# Patient Record
Sex: Female | Born: 1985 | Race: Black or African American | Hispanic: No | Marital: Married | Smoking: Never smoker
Health system: Southern US, Community
[De-identification: ages and names within clinical notes are randomized; demographics above are authoritative.]

## PROBLEM LIST (undated history)

## (undated) ENCOUNTER — Inpatient Hospital Stay (HOSPITAL_COMMUNITY): Payer: Self-pay

---

## 2009-10-16 ENCOUNTER — Emergency Department (HOSPITAL_COMMUNITY): Admission: EM | Admit: 2009-10-16 | Discharge: 2009-10-16 | Payer: Self-pay | Admitting: Family Medicine

## 2009-10-20 ENCOUNTER — Inpatient Hospital Stay (HOSPITAL_COMMUNITY): Admission: AD | Admit: 2009-10-20 | Discharge: 2009-10-20 | Payer: Self-pay | Admitting: Obstetrics and Gynecology

## 2009-10-20 ENCOUNTER — Emergency Department (HOSPITAL_COMMUNITY): Admission: EM | Admit: 2009-10-20 | Discharge: 2009-10-20 | Payer: Self-pay | Admitting: Family Medicine

## 2009-10-20 ENCOUNTER — Ambulatory Visit: Payer: Self-pay | Admitting: Advanced Practice Midwife

## 2009-10-31 ENCOUNTER — Ambulatory Visit: Payer: Self-pay | Admitting: Family

## 2009-10-31 ENCOUNTER — Inpatient Hospital Stay (HOSPITAL_COMMUNITY): Admission: AD | Admit: 2009-10-31 | Discharge: 2009-10-31 | Payer: Self-pay | Admitting: Obstetrics & Gynecology

## 2010-01-14 ENCOUNTER — Ambulatory Visit (HOSPITAL_COMMUNITY): Admission: RE | Admit: 2010-01-14 | Discharge: 2010-01-14 | Payer: Self-pay | Admitting: Obstetrics & Gynecology

## 2010-04-05 ENCOUNTER — Emergency Department (HOSPITAL_COMMUNITY): Admission: EM | Admit: 2010-04-05 | Discharge: 2010-04-05 | Payer: Self-pay | Admitting: Emergency Medicine

## 2010-05-06 ENCOUNTER — Emergency Department (HOSPITAL_COMMUNITY): Admission: EM | Admit: 2010-05-06 | Discharge: 2010-05-06 | Payer: Self-pay | Admitting: Emergency Medicine

## 2010-06-12 ENCOUNTER — Encounter: Payer: Self-pay | Admitting: Obstetrics & Gynecology

## 2010-06-12 ENCOUNTER — Ambulatory Visit: Payer: Self-pay | Admitting: Obstetrics & Gynecology

## 2010-07-23 ENCOUNTER — Inpatient Hospital Stay (HOSPITAL_COMMUNITY): Admission: AD | Admit: 2010-07-23 | Discharge: 2010-06-15 | Payer: Self-pay | Admitting: Obstetrics & Gynecology

## 2010-09-05 ENCOUNTER — Encounter: Payer: Self-pay | Admitting: Family Medicine

## 2010-10-28 LAB — CBC
HCT: 21.3 % — ABNORMAL LOW (ref 36.0–46.0)
HCT: 27.1 % — ABNORMAL LOW (ref 36.0–46.0)
HCT: 33.7 % — ABNORMAL LOW (ref 36.0–46.0)
Hemoglobin: 11 g/dL — ABNORMAL LOW (ref 12.0–15.0)
Hemoglobin: 6.9 g/dL — CL (ref 12.0–15.0)
MCH: 24.7 pg — ABNORMAL LOW (ref 26.0–34.0)
MCH: 26.4 pg (ref 26.0–34.0)
MCHC: 32.4 g/dL (ref 30.0–36.0)
MCV: 79.4 fL (ref 78.0–100.0)
Platelets: 103 10*3/uL — ABNORMAL LOW (ref 150–400)
RBC: 2.79 MIL/uL — ABNORMAL LOW (ref 3.87–5.11)
RBC: 3.42 MIL/uL — ABNORMAL LOW (ref 3.87–5.11)
RBC: 4.43 MIL/uL (ref 3.87–5.11)
RDW: 19.9 % — ABNORMAL HIGH (ref 11.5–15.5)
WBC: 10.2 10*3/uL (ref 4.0–10.5)

## 2010-10-28 LAB — TYPE AND SCREEN

## 2010-10-28 LAB — RPR: RPR Ser Ql: NONREACTIVE

## 2010-11-09 LAB — CBC
Hemoglobin: 12.7 g/dL (ref 12.0–15.0)
Hemoglobin: 12.8 g/dL (ref 12.0–15.0)
MCHC: 33.6 g/dL (ref 30.0–36.0)
MCHC: 33.9 g/dL (ref 30.0–36.0)
MCV: 91.9 fL (ref 78.0–100.0)
RBC: 4.09 MIL/uL (ref 3.87–5.11)
RDW: 12.7 % (ref 11.5–15.5)
WBC: 5.2 10*3/uL (ref 4.0–10.5)

## 2010-11-09 LAB — COMPREHENSIVE METABOLIC PANEL
ALT: 21 U/L (ref 0–35)
AST: 24 U/L (ref 0–37)
Alkaline Phosphatase: 56 U/L (ref 39–117)
CO2: 23 mEq/L (ref 19–32)
Calcium: 8.8 mg/dL (ref 8.4–10.5)
Chloride: 105 mEq/L (ref 96–112)
GFR calc Af Amer: >60 mL/min (ref >60)
GFR calc non Af Amer: >60 mL/min (ref >60)
Glucose, Bld: 67 mg/dL — ABNORMAL LOW (ref 70–99)
Potassium: 3.4 mEq/L — ABNORMAL LOW (ref 3.5–5.1)
Sodium: 134 mEq/L — ABNORMAL LOW (ref 135–145)
Total Bilirubin: 0.4 mg/dL (ref 0.3–1.2)

## 2010-11-09 LAB — WET PREP, GENITAL

## 2010-11-09 LAB — POCT URINALYSIS DIP (DEVICE)
Glucose, UA: NEGATIVE mg/dL
Nitrite: NEGATIVE
Protein, ur: NEGATIVE mg/dL
Specific Gravity, Urine: 1.015 (ref 1.005–1.030)
Urobilinogen, UA: 0.2 mg/dL (ref 0.0–1.0)

## 2010-11-09 LAB — POCT PREGNANCY, URINE: Preg Test, Ur: POSITIVE

## 2010-11-09 LAB — GC/CHLAMYDIA PROBE AMP, GENITAL: GC Probe Amp, Genital: NEGATIVE

## 2010-11-24 IMAGING — US US RENAL
1 series · 14 of 25 positions shown · non-contrast
Comparison: None

CLINICAL DATA: Early pregnancy.  Back pain.  Vomiting.

RENAL/URINARY TRACT ULTRASOUND COMPLETE

[Series 1: us renal · 14 of 40 slices shown]
[im 1/40]
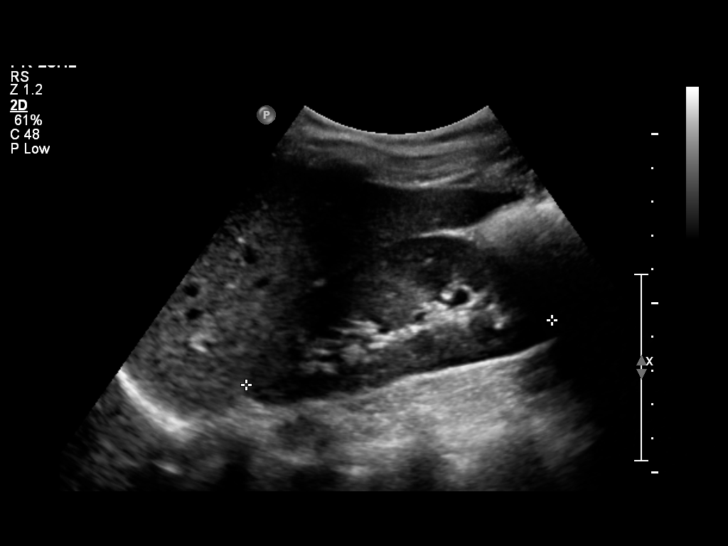
[im 4/40]
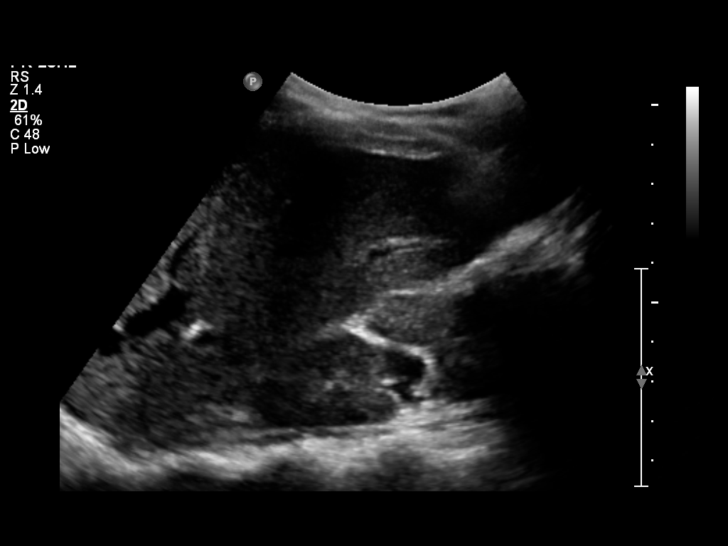
[im 7/40]
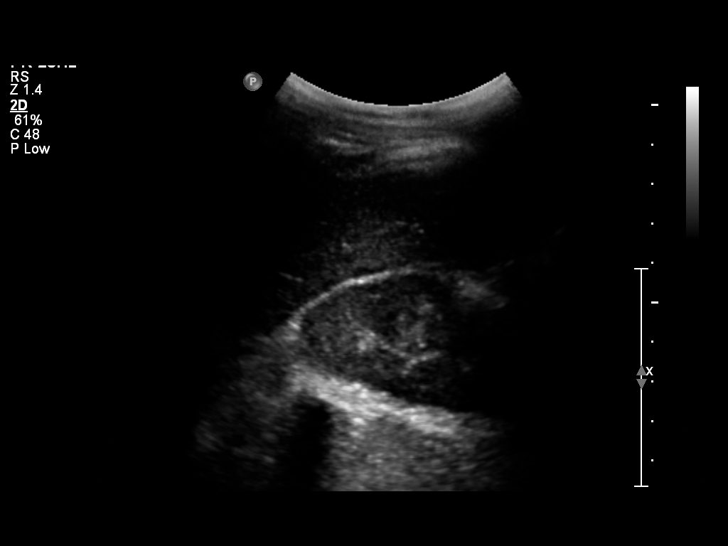
[im 10/40]
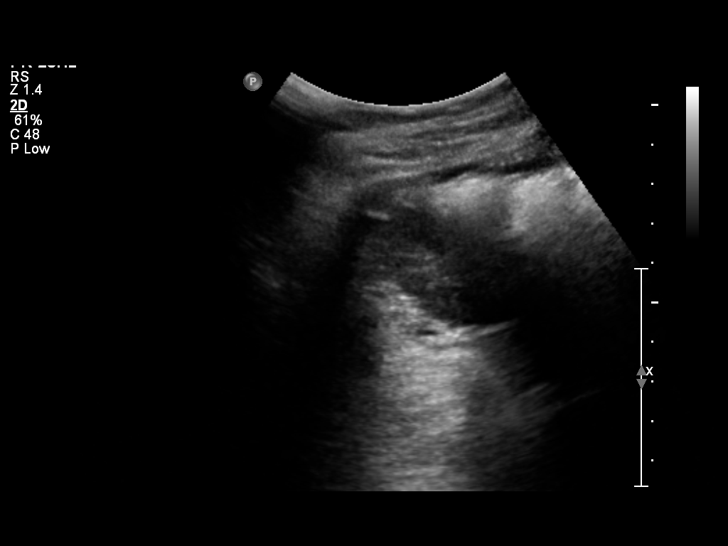
[im 14/40]
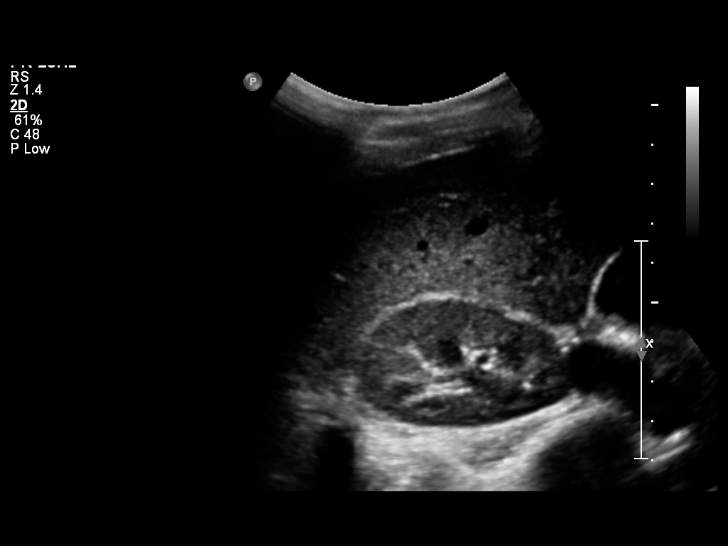
[im 15/40]
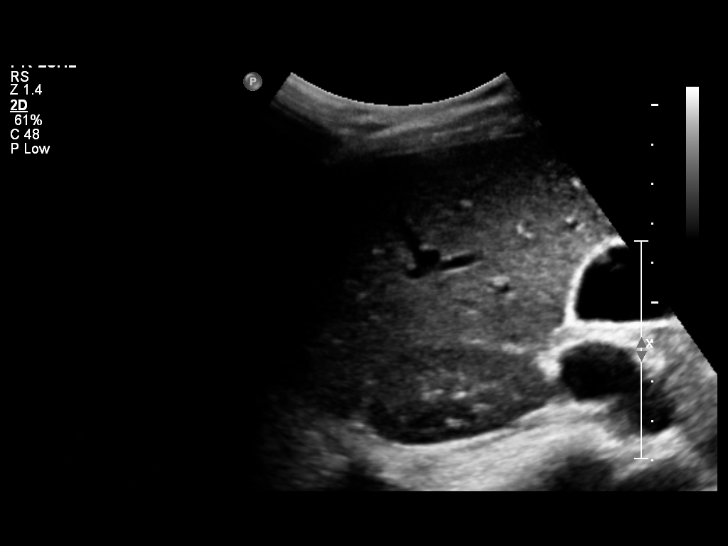
[im 18/40]
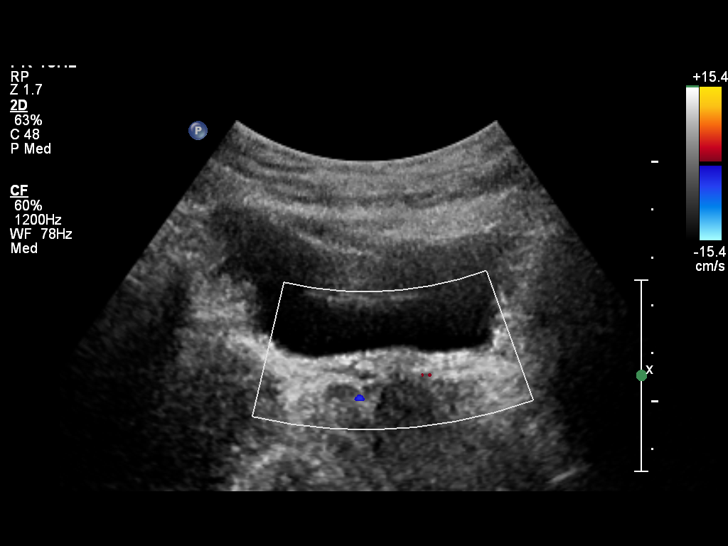
[im 22/40]
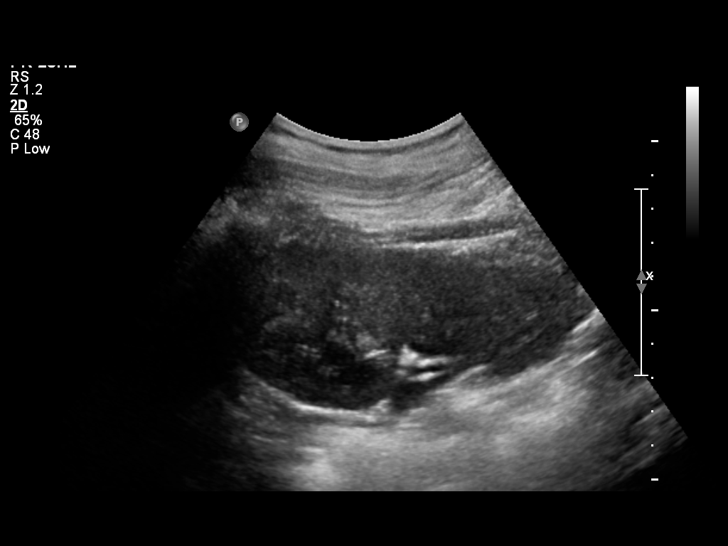
[im 25/40]
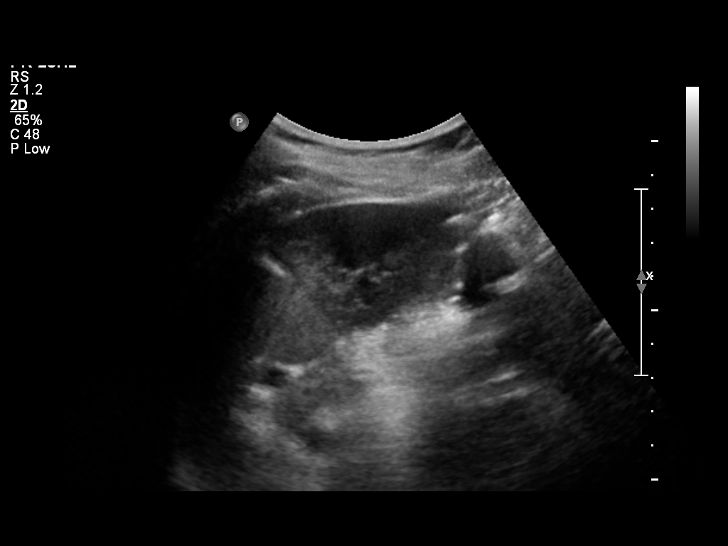
[im 27/40]
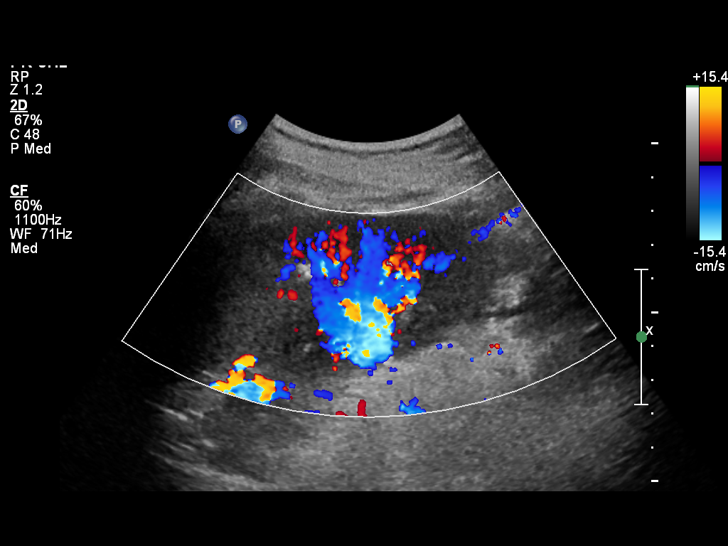
[im 30/40]
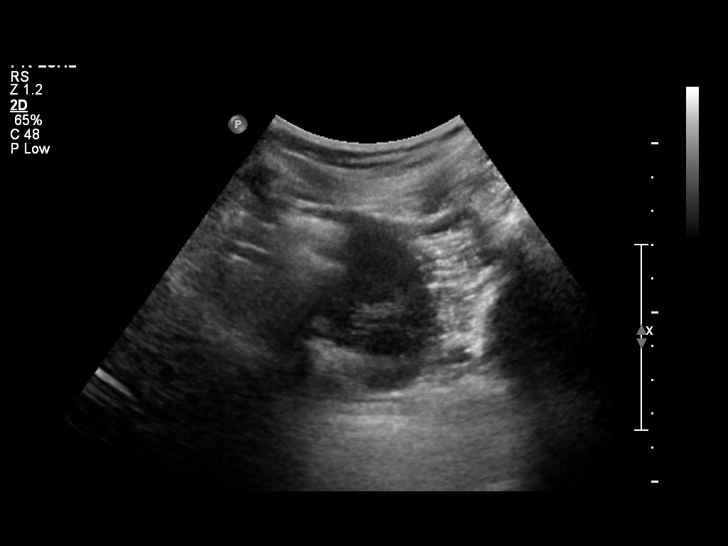
[im 33/40]
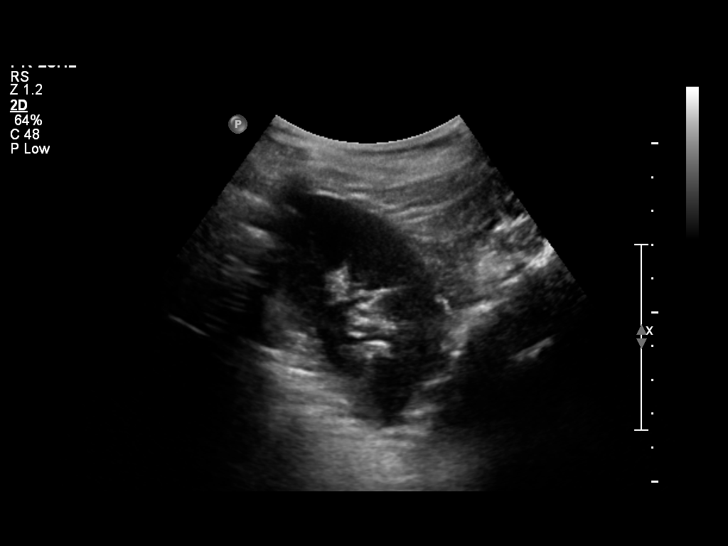
[im 36/40]
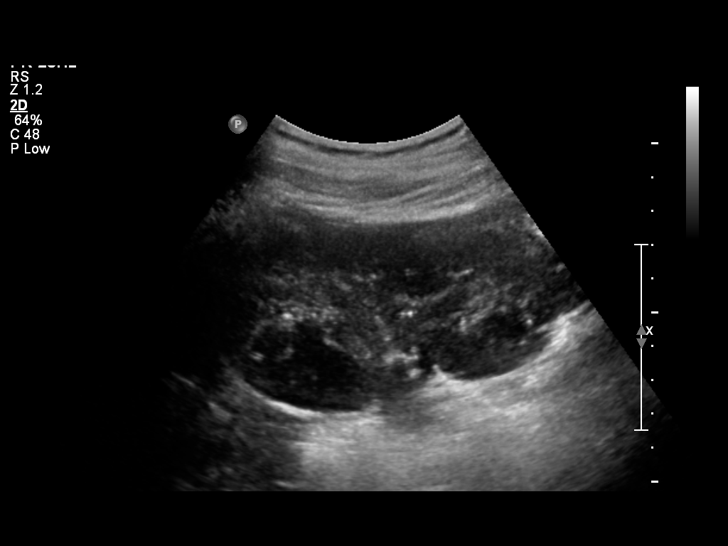
[im 40/40]
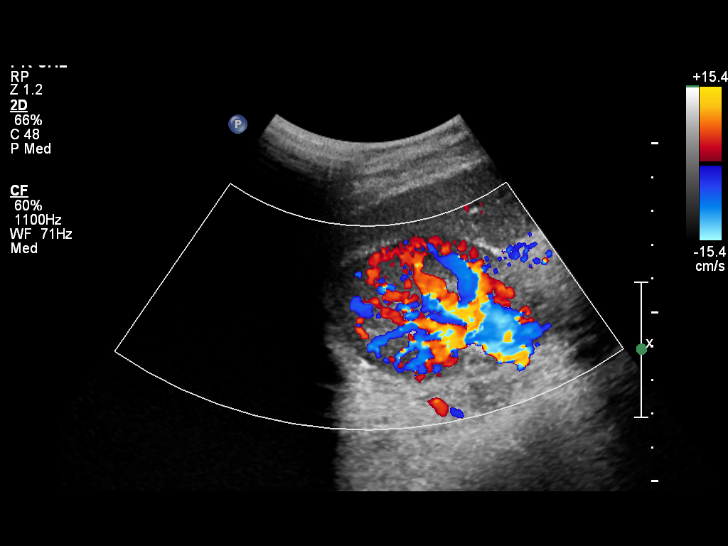

[14 of 25 positions shown; findings below may reference images not displayed]

FINDINGS: Right Kidney:  Normal appearance measuring 9.3 cm in length.  No
cyst, mass, stone or hydronephrosis.

Left Kidney:  Normal appearance measuring 9.5 cm in length.  No
cyst, mass, stone or hydronephrosis.

Bladder:  Normal.  No distention.
IMPRESSION: Normal examination.  No sonographic evidence of pyelonephritis or
obstruction.

## 2013-08-16 NOTE — L&D Delivery Note (Signed)
Delivery Note At 3:03 AM a viable and healthy female was delivered via VBAC, Spontaneous (Presentation:LOA ;  ).  APGAR: , ; weight  .   Placenta status: Intact, Spontaneous.  Cord: 3 vessels with the following complications: Tight nuchal x 1.  THick meconium staining.   Upon delivery of head, attempted to deliver anterior shoulder. McRoberts employed with no delivery. Suprapubic pressure applied and anterior shoulder delivered. Baby had a lot of fluid and meconium, so was handed to RN team for suctioning.   Anesthesia:  Epidural Episiotomy:  none Lacerations:  Abrasions, perineal Suture Repair: none Est. Blood Loss (mL):    Mom to postpartum.  Baby to Couplet care / Skin to Skin.  [facility] Center[facility]ILL[facility]AMS,Dawn Yoshida 08/08/2014, 3:44 AM

## 2014-02-01 ENCOUNTER — Inpatient Hospital Stay (HOSPITAL_COMMUNITY)
Admission: AD | Admit: 2014-02-01 | Discharge: 2014-02-01 | Disposition: A | Discharge status: Home / Self Care | Payer: Medicaid Other | Source: Ambulatory Visit | Attending: Obstetrics & Gynecology | Admitting: Obstetrics & Gynecology

## 2014-02-01 ENCOUNTER — Encounter (HOSPITAL_COMMUNITY): Payer: Self-pay | Admitting: *Deleted

## 2014-02-01 DIAGNOSIS — R519 Headache, unspecified: Secondary | ICD-10-CM

## 2014-02-01 DIAGNOSIS — O26891 Other specified pregnancy related conditions, first trimester: Secondary | ICD-10-CM

## 2014-02-01 DIAGNOSIS — K2100 Gastro-esophageal reflux disease with esophagitis, without bleeding: Secondary | ICD-10-CM

## 2014-02-01 DIAGNOSIS — R51 Headache: Secondary | ICD-10-CM | POA: Insufficient documentation

## 2014-02-01 DIAGNOSIS — K209 Esophagitis, unspecified without bleeding: Secondary | ICD-10-CM | POA: Insufficient documentation

## 2014-02-01 DIAGNOSIS — O210 Mild hyperemesis gravidarum: Secondary | ICD-10-CM | POA: Insufficient documentation

## 2014-02-01 DIAGNOSIS — K219 Gastro-esophageal reflux disease without esophagitis: Secondary | ICD-10-CM | POA: Insufficient documentation

## 2014-02-01 DIAGNOSIS — Z87891 Personal history of nicotine dependence: Secondary | ICD-10-CM | POA: Insufficient documentation

## 2014-02-01 DIAGNOSIS — O219 Vomiting of pregnancy, unspecified: Secondary | ICD-10-CM

## 2014-02-01 DIAGNOSIS — R079 Chest pain, unspecified: Secondary | ICD-10-CM | POA: Insufficient documentation

## 2014-02-01 LAB — POCT PREGNANCY, URINE: PREG TEST UR: POSITIVE — AB

## 2014-02-01 LAB — URINALYSIS, ROUTINE W REFLEX MICROSCOPIC
Bilirubin Urine: NEGATIVE
GLUCOSE, UA: NEGATIVE mg/dL
HGB URINE DIPSTICK: NEGATIVE
Ketones, ur: NEGATIVE mg/dL
Nitrite: NEGATIVE
Protein, ur: NEGATIVE mg/dL
SPECIFIC GRAVITY, URINE: 1.01 (ref 1.005–1.030)
UROBILINOGEN UA: 0.2 mg/dL (ref 0.0–1.0)
pH: 6 (ref 5.0–8.0)

## 2014-02-01 LAB — CBC
HEMATOCRIT: 29.9 % — AB (ref 36.0–46.0)
Hemoglobin: 10.3 g/dL — ABNORMAL LOW (ref 12.0–15.0)
MCH: 30.7 pg (ref 26.0–34.0)
MCHC: 34.4 g/dL (ref 30.0–36.0)
MCV: 89 fL (ref 78.0–100.0)
Platelets: 121 10*3/uL — ABNORMAL LOW (ref 150–400)
RBC: 3.36 MIL/uL — ABNORMAL LOW (ref 3.87–5.11)
RDW: 13.6 % (ref 11.5–15.5)
WBC: 5.9 10*3/uL (ref 4.0–10.5)

## 2014-02-01 LAB — COMPREHENSIVE METABOLIC PANEL
ALBUMIN: 2.7 g/dL — AB (ref 3.5–5.2)
ALK PHOS: 40 U/L (ref 39–117)
ALT: 8 U/L (ref 0–35)
AST: 12 U/L (ref 0–37)
BUN: 5 mg/dL — AB (ref 6–23)
CALCIUM: 8.3 mg/dL — AB (ref 8.4–10.5)
CO2: 23 mEq/L (ref 19–32)
CREATININE: 0.58 mg/dL (ref 0.50–1.10)
Chloride: 104 mEq/L (ref 96–112)
GFR calc Af Amer: >90 mL/min (ref >90)
GFR calc non Af Amer: >90 mL/min (ref >90)
Glucose, Bld: 72 mg/dL (ref 70–99)
POTASSIUM: 3.4 meq/L — AB (ref 3.7–5.3)
Sodium: 136 mEq/L — ABNORMAL LOW (ref 137–147)
TOTAL PROTEIN: 5.7 g/dL — AB (ref 6.0–8.3)
Total Bilirubin: 0.3 mg/dL (ref 0.3–1.2)

## 2014-02-01 LAB — URINE MICROSCOPIC-ADD ON

## 2014-02-01 MED ORDER — PROMETHAZINE HCL 25 MG/ML IJ SOLN
25.0000 mg | INTRAMUSCULAR | Status: AC
  Administered 2014-02-01: 25 mg via INTRAVENOUS
  Filled 2014-02-01: qty 1

## 2014-02-01 MED ORDER — RANITIDINE HCL 150 MG PO TABS: 150.0000 mg | ORAL_TABLET | Freq: Two times a day (BID) | ORAL | Status: DC

## 2014-02-01 MED ORDER — GI COCKTAIL ~~LOC~~
30.0000 mL | ORAL | Status: AC
  Administered 2014-02-01: 30 mL via ORAL
  Filled 2014-02-01: qty 30

## 2014-02-01 MED ORDER — LACTATED RINGERS IV BOLUS (SEPSIS)
1000.0000 mL | Freq: Once | INTRAVENOUS | Status: AC
  Administered 2014-02-01: 1000 mL via INTRAVENOUS

## 2014-02-01 MED ORDER — BUTALBITAL-APAP-CAFFEINE 50-325-40 MG PO TABS
2.0000 | ORAL_TABLET | ORAL | Status: AC
  Administered 2014-02-01: 2 via ORAL
  Filled 2014-02-01: qty 2

## 2014-02-01 MED ORDER — BUTALBITAL-APAP-CAFFEINE 50-325-40 MG PO TABS: 2.0000 | ORAL_TABLET | ORAL | Status: DC

## 2014-02-01 MED ORDER — PROMETHAZINE HCL 25 MG PO TABS: 12.5000 mg | ORAL_TABLET | Freq: Four times a day (QID) | ORAL | Status: DC | PRN

## 2014-02-01 NOTE — Discharge Instructions (Signed)
Nausea medication to take during pregnancy:   Unisom (doxylamine succinate 25 mg tablets) Take one tablet daily at bedtime. If symptoms are not adequately controlled, the dose can be increased to a maximum recommended dose of two tablets daily (1/2 tablet in the morning, 1/2 tablet mid-afternoon and one at bedtime).  Vitamin B6 100mg  tablets. Take one tablet twice a day (up to 200 mg per day).  Add Phenergan as prescribed to take as needed.   Nausea/Vomiting of Pregnancy Morning sickness is when you feel sick to your stomach (nauseous) during pregnancy. This nauseous feeling may or may not come with vomiting. It often occurs in the morning but can be a problem any time of day. Morning sickness is most common during the first trimester, but it may continue throughout pregnancy. While morning sickness is unpleasant, it is usually harmless unless you develop severe and continual vomiting (hyperemesis gravidarum). This condition requires more intense treatment.  CAUSES  The cause of morning sickness is not completely known but seems to be related to normal hormonal changes that occur in pregnancy. RISK FACTORS You are at greater risk if you:  Experienced nausea or vomiting before your pregnancy.  Had morning sickness during a previous pregnancy.  Are pregnant with more than one baby, such as twins. TREATMENT  Do not use any medicines (prescription, over-the-counter, or herbal) for morning sickness without first talking to your health care provider. Your health care provider may prescribe or recommend:  Vitamin B6 supplements.  Anti-nausea medicines.  The herbal medicine ginger. HOME CARE INSTRUCTIONS   Only take over-the-counter or prescription medicines as directed by your health care provider.  Taking multivitamins before getting pregnant can prevent or decrease the severity of morning sickness in most women.  Eat a piece of dry toast or unsalted crackers before getting out of bed in  the morning.  Eat five or six small meals a day.  Eat dry and bland foods (rice, baked potato). Foods high in carbohydrates are often helpful.  Do not drink liquids with your meals. Drink liquids between meals.  Avoid greasy, fatty, and spicy foods.  Get someone to cook for you if the smell of any food causes nausea and vomiting.  If you feel nauseous after taking prenatal vitamins, take the vitamins at night or with a snack.  Snack on protein foods (nuts, yogurt, cheese) between meals if you are hungry.  Eat unsweetened gelatins for desserts.  Wearing an acupressure wristband (worn for sea sickness) may be helpful.  Acupuncture may be helpful.  Do not smoke.  Get a humidifier to keep the air in your house free of odors.  Get plenty of fresh air. SEEK MEDICAL CARE IF:   Your home remedies are not working, and you need medicine.  You feel dizzy or lightheaded.  You are losing weight. SEEK IMMEDIATE MEDICAL CARE IF:   You have persistent and uncontrolled nausea and vomiting.  You pass out (faint). MAKE SURE YOU:  Understand these instructions.  Will watch your condition.  Will get help right away if you are not doing well or get worse. Document Released: 09/23/2006 Document Revised: 08/07/2013 Document Reviewed: 01/17/2013 [facility] Patient Information 2015 [place], [place]. This information is not intended to replace advice given to you by your health care provider. Make sure you discuss any questions you have with your health care provider. Heartburn During Pregnancy  Heartburn is a burning sensation in the chest caused by stomach acid backing up into the esophagus. Heartburn is common  in pregnancy because a certain hormone (progesterone) is released when a woman is pregnant. The progesterone hormone may relax the valve that separates the esophagus from the stomach. This allows acid to go up into the esophagus, causing heartburn. Heartburn may also happen in pregnancy  because the enlarging uterus pushes up on the stomach, which pushes more acid into the esophagus. This is especially true in the later stages of pregnancy. Heartburn problems usually go away after giving birth. CAUSES  Heartburn is caused by stomach acid backing up into the esophagus. During pregnancy, this may result from various things, including:   The progesterone hormone.  Changing hormone levels.  The growing uterus pushing stomach acid upward.  Large meals.  Certain foods and drinks.  Exercise.  Increased acid production. SIGNS AND SYMPTOMS   Burning pain in the chest or lower throat.  Bitter taste in the mouth.  Coughing. DIAGNOSIS  Your health care provider will typically diagnose heartburn by taking a careful history of your concern. Blood tests may be done to check for a certain type of bacteria that is associated with heartburn. Sometimes, heartburn is diagnosed by prescribing a heartburn medicine to see if the symptoms improve. In some cases, a procedure called an endoscopy may be done. In this procedure, a tube with a light and a camera on the end (endoscope) is used to examine the esophagus and the stomach. TREATMENT  Treatment will vary depending on the severity of your symptoms. Your health care provider may recommend:  Over-the-counter medicines (antacids, acid reducers) for mild heartburn.  Prescription medicines to decrease stomach acid or to protect your stomach lining.  Certain changes in your diet.  Elevating the head of your bed by putting blocks under the legs. This helps prevent stomach acid from backing up into the esophagus when you are lying down. HOME CARE INSTRUCTIONS   Only take over-the-counter or prescription medicines as directed by your health care provider.  Raise the head of your bed by putting blocks under the legs if instructed to do so by your health care provider. Sleeping with more pillows is not effective because it only changes the  position of your head.  Do not exercise right after eating.  Avoid eating 2-3 hours before bed. Do not lie down right after eating.  Eat small meals throughout the day instead of three large meals.  Identify foods and beverages that make your symptoms worse and avoid them. Foods you may want to avoid include:  Peppers.  Chocolate.  High-fat foods, including fried foods.  Spicy foods.  Garlic and onions.  Citrus fruits, including oranges, grapefruit, lemons, and limes.  Food containing tomatoes or tomato products.  Mint.  Carbonated and caffeinated drinks.  Vinegar. SEEK MEDICAL CARE IF:  You have abdominal pain of any kind.  You feel burning in your upper abdomen or chest, especially after eating or lying down.  You have nausea and vomiting.  Your stomach feels upset after you eat. SEEK IMMEDIATE MEDICAL CARE IF:   You have severe chest pain that goes down your arm or into your jaw or neck.  You feel sweaty, dizzy, or light-headed.  You become short of breath.  You vomit blood.  You have difficulty or pain with swallowing.  You have bloody or black, tarry stools.  You have episodes of heartburn more than 3 times a week, for more than 2 weeks. MAKE SURE YOU:  Understand these instructions.  Will watch your condition.  Will get help right  away if you are not doing well or get worse. Document Released: 07/30/2000 Document Revised: 08/07/2013 Document Reviewed: 03/21/2013 [facility] Patient Information 2015 [place], [place]. This information is not intended to replace advice given to you by your health care provider. Make sure you discuss any questions you have with your health care provider. Headaches, Frequently Asked Questions MIGRAINE HEADACHES Q: What is migraine? What causes it? How can I treat it? A: Generally, migraine headaches begin as a dull ache. Then they develop into a constant, throbbing, and pulsating pain. You may experience pain at the  temples. You may experience pain at the front or back of one or both sides of the head. The pain is usually accompanied by a combination of:  Nausea.  Vomiting.  Sensitivity to light and noise. Some people (about 15%) experience an aura (see below) before an attack. The cause of migraine is believed to be chemical reactions in the brain. Treatment for migraine may include over-the-counter or prescription medications. It may also include self-help techniques. These include relaxation training and biofeedback.  Q: What is an aura? A: About 15% of people with migraine get an "aura". This is a sign of neurological symptoms that occur before a migraine headache. You may see wavy or jagged lines, dots, or flashing lights. You might experience tunnel vision or blind spots in one or both eyes. The aura can include visual or auditory hallucinations (something imagined). It may include disruptions in smell (such as strange odors), taste or touch. Other symptoms include:  Numbness.  A "pins and needles" sensation.  Difficulty in recalling or speaking the correct word. These neurological events may last as long as 60 minutes. These symptoms will fade as the headache begins. Q: What is a trigger? A: Certain physical or environmental factors can lead to or "trigger" a migraine. These include:  Foods.  Hormonal changes.  Weather.  Stress. It is important to remember that triggers are different for everyone. To help prevent migraine attacks, you need to figure out which triggers affect you. Keep a headache diary. This is a good way to track triggers. The diary will help you talk to your healthcare professional about your condition. Q: Does weather affect migraines? A: Bright sunshine, hot, humid conditions, and drastic changes in barometric pressure may lead to, or "trigger," a migraine attack in some people. But studies have shown that weather does not act as a trigger for everyone with migraines. Q:  What is the link between migraine and hormones? A: Hormones start and regulate many of your body's functions. Hormones keep your body in balance within a constantly changing environment. The levels of hormones in your body are unbalanced at times. Examples are during menstruation, pregnancy, or menopause. That can lead to a migraine attack. In fact, about three quarters of all women with migraine report that their attacks are related to the menstrual cycle.  Q: Is there an increased risk of stroke for migraine sufferers? A: The likelihood of a migraine attack causing a stroke is very remote. That is not to say that migraine sufferers cannot have a stroke associated with their migraines. In persons under age 63, the most common associated factor for stroke is migraine headache. But over the course of a person's normal life span, the occurrence of migraine headache may actually be associated with a reduced risk of dying from cerebrovascular disease due to stroke.  Q: What are acute medications for migraine? A: Acute medications are used to treat the pain of the  headache after it has started. Examples over-the-counter medications, NSAIDs, ergots, and triptans.  Q: What are the triptans? A: Triptans are the newest class of abortive medications. They are specifically targeted to treat migraine. Triptans are vasoconstrictors. They moderate some chemical reactions in the brain. The triptans work on receptors in your brain. Triptans help to restore the balance of a neurotransmitter called serotonin. Fluctuations in levels of serotonin are thought to be a main cause of migraine.  Q: Are over-the-counter medications for migraine effective? A: Over-the-counter, or "OTC," medications may be effective in relieving mild to moderate pain and associated symptoms of migraine. But you should see your caregiver before beginning any treatment regimen for migraine.  Q: What are preventive medications for migraine? A:  Preventive medications for migraine are sometimes referred to as "prophylactic" treatments. They are used to reduce the frequency, severity, and length of migraine attacks. Examples of preventive medications include antiepileptic medications, antidepressants, beta-blockers, calcium channel blockers, and NSAIDs (nonsteroidal anti-inflammatory drugs). Q: Why are anticonvulsants used to treat migraine? A: During the past few years, there has been an increased interest in antiepileptic drugs for the prevention of migraine. They are sometimes referred to as "anticonvulsants". Both epilepsy and migraine may be caused by similar reactions in the brain.  Q: Why are antidepressants used to treat migraine? A: Antidepressants are typically used to treat people with depression. They may reduce migraine frequency by regulating chemical levels, such as serotonin, in the brain.  Q: What alternative therapies are used to treat migraine? A: The term "alternative therapies" is often used to describe treatments considered outside the scope of conventional Western medicine. Examples of alternative therapy include acupuncture, acupressure, and yoga. Another common alternative treatment is herbal therapy. Some herbs are believed to relieve headache pain. Always discuss alternative therapies with your caregiver before proceeding. Some herbal products contain arsenic and other toxins. TENSION HEADACHES Q: What is a tension-type headache? What causes it? How can I treat it? A: Tension-type headaches occur randomly. They are often the result of temporary stress, anxiety, fatigue, or anger. Symptoms include soreness in your temples, a tightening band-like sensation around your head (a "vice-like" ache). Symptoms can also include a pulling feeling, pressure sensations, and contracting head and neck muscles. The headache begins in your forehead, temples, or the back of your head and neck. Treatment for tension-type headache may include  over-the-counter or prescription medications. Treatment may also include self-help techniques such as relaxation training and biofeedback. CLUSTER HEADACHES Q: What is a cluster headache? What causes it? How can I treat it? A: Cluster headache gets its name because the attacks come in groups. The pain arrives with little, if any, warning. It is usually on one side of the head. A tearing or bloodshot eye and a runny nose on the same side of the headache may also accompany the pain. Cluster headaches are believed to be caused by chemical reactions in the brain. They have been described as the most severe and intense of any headache type. Treatment for cluster headache includes prescription medication and oxygen. SINUS HEADACHES Q: What is a sinus headache? What causes it? How can I treat it? A: When a cavity in the bones of the face and skull (a sinus) becomes inflamed, the inflammation will cause localized pain. This condition is usually the result of an allergic reaction, a tumor, or an infection. If your headache is caused by a sinus blockage, such as an infection, you will probably have a fever. An x-ray will  confirm a sinus blockage. Your caregiver's treatment might include antibiotics for the infection, as well as antihistamines or decongestants.  REBOUND HEADACHES Q: What is a rebound headache? What causes it? How can I treat it? A: A pattern of taking acute headache medications too often can lead to a condition known as "rebound headache." A pattern of taking too much headache medication includes taking it more than 2 days per week or in excessive amounts. That means more than the label or a caregiver advises. With rebound headaches, your medications not only stop relieving pain, they actually begin to cause headaches. Doctors treat rebound headache by tapering the medication that is being overused. Sometimes your caregiver will gradually substitute a different type of treatment or medication.  Stopping may be a challenge. Regularly overusing a medication increases the potential for serious side effects. Consult a caregiver if you regularly use headache medications more than 2 days per week or more than the label advises. ADDITIONAL QUESTIONS AND ANSWERS Q: What is biofeedback? A: Biofeedback is a self-help treatment. Biofeedback uses special equipment to monitor your body's involuntary physical responses. Biofeedback monitors:  Breathing.  Pulse.  Heart rate.  Temperature.  Muscle tension.  Brain activity. Biofeedback helps you refine and perfect your relaxation exercises. You learn to control the physical responses that are related to stress. Once the technique has been mastered, you do not need the equipment any more. Q: Are headaches hereditary? A: Four out of five (80%) of people that suffer report a family history of migraine. Scientists are not sure if this is genetic or a family predisposition. Despite the uncertainty, a child has a 50% chance of having migraine if one parent suffers. The child has a 75% chance if both parents suffer.  Q: Can children get headaches? A: By the time they reach high school, most young people have experienced some type of headache. Many safe and effective approaches or medications can prevent a headache from occurring or stop it after it has begun.  Q: What type of doctor should I see to diagnose and treat my headache? A: Start with your primary caregiver. Discuss his or her experience and approach to headaches. Discuss methods of classification, diagnosis, and treatment. Your caregiver may decide to recommend you to a headache specialist, depending upon your symptoms or other physical conditions. Having diabetes, allergies, etc., may require a more comprehensive and inclusive approach to your headache. The National [employer] Foundation will provide, upon request, a list of [facility] physician members in your state. Document Released: 10/23/2003  Document Revised: 10/25/2011 Document Reviewed: 04/01/2008 [facility] Patient Information 2015 [place], [place]. This information is not intended to replace advice given to you by your health care provider. Make sure you discuss any questions you have with your health care provider.

## 2014-02-01 NOTE — MAU Note (Signed)
Pt presents to MAU with complaints of nausea, vomiting, and headache that started a couple of weeks ago. She reports that she was evaluated in the ED a couple of times and given nausea medication

## 2014-02-01 NOTE — MAU Provider Note (Signed)
Chief Complaint: Morning Sickness   First Provider Initiated Contact with Patient 02/01/14 0914     SUBJECTIVE HPI: Dawn Banks is a 28 y.o. G4P3003 at 3472w5d by LMP who presents to maternity admissions reporting nausea/vomiting, h/a, and burning chest pain.  She also reports dysuria. The n/v and chest burning started 2 months ago with h/a, and dysuria starting in the last week.  She has appt for prenatal care [facility] on [facility]/22 for paperwork/labs but will not see a provider for a few more weeks.  She was seen at [facility] and given prescriptions for antinausea medicine but she is currently out of these.  She denies LOF, vaginal bleeding, vaginal itching/burning, dizziness, n/v, or fever/chills.     No past medical history on file. No past surgical history on file. History   Social History  . Marital Status: Married    Spouse Name: N/A    Number of Children: N/A  . Years of Education: N/A   Occupational History  . Not on file.   Social History Main Topics  . Smoking status: Not on file  . Smokeless tobacco: Not on file  . Alcohol Use: Not on file  . Drug Use: Not on file  . Sexual Activity: Not on file   Other Topics Concern  . Not on file   Social History Narrative  . No narrative on file   No current facility-administered medications on file prior to encounter.   No current outpatient prescriptions on file prior to encounter.   Allergies  Allergen Reactions  . Prenatal Vitamins Nausea And Vomiting    Vomiting blood    ROS: Pertinent items in HPI  OBJECTIVE Blood pressure 112/64, pulse 68, resp. rate 18, height 5\' 3"  (1.6 m), weight 53.524 kg (118 lb), last menstrual period 11/04/2013. GENERAL: Well-developed, well-nourished female in no acute distress.  HEENT: Normocephalic HEART: normal rate, rhythm, heart sounds RESP: normal effort, lungs clear and equal bilaterally ABDOMEN: Soft, non-tender EXTREMITIES: Nontender, no  edema Neurological - alert, oriented, normal speech, no focal findings or movement disorder noted, screening mental status exam normal, cranial nerves II through XII intact  FHT 140 by doppler  LAB RESULTS Results for orders placed during the hospital encounter of 02/01/14 (from the past 24 hour(s))  URINALYSIS, ROUTINE W REFLEX MICROSCOPIC     Status: Abnormal   Collection Time    02/01/14  8:40 AM      Result Value Ref Range   Color, Urine YELLOW  YELLOW   APPearance CLEAR  CLEAR   Specific Gravity, Urine 1.010  1.005 - 1.030   pH 6.0  5.0 - 8.0   Glucose, UA NEGATIVE  NEGATIVE mg/dL   Hgb urine dipstick NEGATIVE  NEGATIVE   Bilirubin Urine NEGATIVE  NEGATIVE   Ketones, ur NEGATIVE  NEGATIVE mg/dL   Protein, ur NEGATIVE  NEGATIVE mg/dL   Urobilinogen, UA 0.2  0.0 - 1.0 mg/dL   Nitrite NEGATIVE  NEGATIVE   Leukocytes, UA SMALL (*) NEGATIVE  URINE MICROSCOPIC-ADD ON     Status: Abnormal   Collection Time    02/01/14  8:40 AM      Result Value Ref Range   Squamous Epithelial / LPF MANY (*) RARE   WBC, UA 3-6  <3 WBC/hpf   RBC / HPF 0-2  <3 RBC/hpf   Bacteria, UA FEW (*) RARE  POCT PREGNANCY, URINE     Status: Abnormal   Collection Time  02/01/14  8:48 AM      Result Value Ref Range   Preg Test, Ur POSITIVE (*) NEGATIVE  CBC     Status: Abnormal   Collection Time    02/01/14  9:30 AM      Result Value Ref Range   WBC 5.9  4.0 - 10.5 K/uL   RBC 3.36 (*) 3.87 - 5.11 MIL/uL   Hemoglobin 10.3 (*) 12.0 - 15.0 g/dL   HCT 74.229.9 (*) 59.536.0 - 63.846.0 %   MCV 89.0  78.0 - 100.0 fL   MCH 30.7  26.0 - 34.0 pg   MCHC 34.4  30.0 - 36.0 g/dL   RDW 75.613.6  43.311.5 - 29.515.5 %   Platelets 121 (*) 150 - 400 K/uL  COMPREHENSIVE METABOLIC PANEL     Status: Abnormal   Collection Time    02/01/14  9:30 AM      Result Value Ref Range   Sodium 136 (*) 137 - 147 mEq/L   Potassium 3.4 (*) 3.7 - 5.3 mEq/L   Chloride 104  96 - 112 mEq/L   CO2 23  19 - 32 mEq/L   Glucose, Bld 72  70 - 99 mg/dL   BUN 5  (*) 6 - 23 mg/dL   Creatinine, Ser 1.880.58  0.50 - 1.10 mg/dL   Calcium 8.3 (*) 8.4 - 10.5 mg/dL   Total Protein 5.7 (*) 6.0 - 8.3 g/dL   Albumin 2.7 (*) 3.5 - 5.2 g/dL   AST 12  0 - 37 U/L   ALT 8  0 - 35 U/L   Alkaline Phosphatase 40  39 - 117 U/L   Total Bilirubin 0.3  0.3 - 1.2 mg/dL   GFR calc non Af Amer >90  >90 mL/min   GFR calc Af Amer >90  >90 mL/min    IMAGING No results found.  ASSESSMENT 1. Nausea/vomiting in pregnancy   2. Gastroesophageal reflux disease with esophagitis   3. Headache in pregnancy, first trimester     PLAN LR x1000 ml, Phenergan 25 mg IV, and GI Cocktail--pt symptoms improved Fioricet x2 tabs with reduction in h/a symptoms per pt D/C home [facility]--message sent Rx for Phenergan, Zantac, Fioricet Discussed OTC Unisom/B6 with pt F/U with prenatal care Return to MAU as needed for emergencies    Medication List         butalbital-acetaminophen-caffeine 50-325-40 MG per tablet  Commonly known as:  FIORICET, ESGIC  Take 2 tablets by mouth stat.     promethazine 25 MG tablet  Commonly known as:  PHENERGAN  Take 0.5-1 tablets (12.5-25 mg total) by mouth every 6 (six) hours as needed.     ranitidine 150 MG tablet  Commonly known as:  ZANTAC  Take 1 tablet (150 mg total) by mouth 2 (two) times daily.       Follow-up Information   Follow up with [facility]. (The clinic will call you with an appointment or call the number below.  Return to MAU as needed.)    Specialty:  Obstetrics and Gynecology   Contact information:   [address] [place] [id number] [phone]      Sharen CounterLisa Leftwich-Kirby Certified Nurse-Midwife 02/01/2014  11:33 AM

## 2014-02-04 ENCOUNTER — Telehealth: Payer: Self-pay | Admitting: General Practice

## 2014-02-04 NOTE — Telephone Encounter (Signed)
Pharmacy called and stated they needed clarification on order for fioricet because they are no instructions following take two immediately. Clarified with Dr Reola CalkinsBeck, fioricet is 1 tab every 6 hours if needed. Called pharmacy and clarified instructions.

## 2014-02-12 ENCOUNTER — Other Ambulatory Visit: Payer: Medicaid Other

## 2014-02-12 DIAGNOSIS — Z3481 Encounter for supervision of other normal pregnancy, first trimester: Secondary | ICD-10-CM

## 2014-02-13 LAB — OBSTETRIC PANEL
ANTIBODY SCREEN: NEGATIVE
Basophils Absolute: 0.1 10*3/uL (ref 0.0–0.1)
Basophils Relative: 1 % (ref 0–1)
EOS PCT: 5 % (ref 0–5)
Eosinophils Absolute: 0.3 10*3/uL (ref 0.0–0.7)
HEMATOCRIT: 27.8 % — AB (ref 36.0–46.0)
HEMOGLOBIN: 9.8 g/dL — AB (ref 12.0–15.0)
Hepatitis B Surface Ag: NEGATIVE
LYMPHS ABS: 1.2 10*3/uL (ref 0.7–4.0)
LYMPHS PCT: 21 % (ref 12–46)
MCH: 31 pg (ref 26.0–34.0)
MCHC: 35.3 g/dL (ref 30.0–36.0)
MCV: 88 fL (ref 78.0–100.0)
MONO ABS: 0.4 10*3/uL (ref 0.1–1.0)
MONOS PCT: 7 % (ref 3–12)
Neutro Abs: 3.7 10*3/uL (ref 1.7–7.7)
Neutrophils Relative %: 66 % (ref 43–77)
Platelets: 137 10*3/uL — ABNORMAL LOW (ref 150–400)
RBC: 3.16 MIL/uL — AB (ref 3.87–5.11)
RDW: 14.5 % (ref 11.5–15.5)
RUBELLA: 3.89 {index} — AB (ref <0.90)
Rh Type: POSITIVE
WBC: 5.6 10*3/uL (ref 4.0–10.5)

## 2014-02-13 LAB — HIV ANTIBODY (ROUTINE TESTING W REFLEX): HIV: NONREACTIVE

## 2014-02-14 LAB — HEMOGLOBINOPATHY EVALUATION
Hemoglobin Other: 0 %
Hgb A2 Quant: 3.2 % (ref 2.2–3.2)
Hgb A: 96.8 % (ref 96.8–97.8)
Hgb F Quant: 0 % (ref 0.0–2.0)
Hgb S Quant: 0 %

## 2014-03-06 ENCOUNTER — Encounter: Payer: Self-pay | Admitting: Advanced Practice Midwife

## 2014-03-06 ENCOUNTER — Ambulatory Visit (INDEPENDENT_AMBULATORY_CARE_PROVIDER_SITE_OTHER): Payer: Medicaid Other | Admitting: Advanced Practice Midwife

## 2014-03-06 ENCOUNTER — Other Ambulatory Visit (HOSPITAL_COMMUNITY)
Admission: RE | Admit: 2014-03-06 | Discharge: 2014-03-06 | Disposition: A | Discharge status: Home / Self Care | Payer: Medicaid Other | Source: Ambulatory Visit | Attending: Advanced Practice Midwife | Admitting: Advanced Practice Midwife

## 2014-03-06 VITALS — BP 106/59 | HR 78 | Temp 98.1°F | Ht 61.0 in | Wt 125.6 lb

## 2014-03-06 DIAGNOSIS — Z113 Encounter for screening for infections with a predominantly sexual mode of transmission: Secondary | ICD-10-CM | POA: Insufficient documentation

## 2014-03-06 DIAGNOSIS — Z01419 Encounter for gynecological examination (general) (routine) without abnormal findings: Secondary | ICD-10-CM | POA: Diagnosis not present

## 2014-03-06 DIAGNOSIS — O34219 Maternal care for unspecified type scar from previous cesarean delivery: Secondary | ICD-10-CM | POA: Insufficient documentation

## 2014-03-06 LAB — POCT URINALYSIS DIP (DEVICE)
Bilirubin Urine: NEGATIVE
Glucose, UA: NEGATIVE mg/dL
HGB URINE DIPSTICK: NEGATIVE
Ketones, ur: NEGATIVE mg/dL
NITRITE: NEGATIVE
PH: 6.5 (ref 5.0–8.0)
Protein, ur: NEGATIVE mg/dL
Specific Gravity, Urine: 1.02 (ref 1.005–1.030)
Urobilinogen, UA: 0.2 mg/dL (ref 0.0–1.0)

## 2014-03-06 NOTE — Patient Instructions (Signed)
Second Trimester of Pregnancy The second trimester is from week 13 through week 28, months 4 through 6. The second trimester is often a time when you feel your best. Your body has also adjusted to being pregnant, and you begin to feel better physically. Usually, morning sickness has lessened or quit completely, you may have more energy, and you may have an increase in appetite. The second trimester is also a time when the fetus is growing rapidly. At the end of the sixth month, the fetus is about 9 inches long and weighs about 1 pounds. You will likely begin to feel the baby move (quickening) between 18 and 20 weeks of the pregnancy. BODY CHANGES Your body goes through many changes during pregnancy. The changes vary from woman to woman.   Your weight will continue to increase. You will notice your lower abdomen bulging out.  You may begin to get stretch marks on your hips, abdomen, and breasts.  You may develop headaches that can be relieved by medicines approved by your health care provider.  You may urinate more often because the fetus is pressing on your bladder.  You may develop or continue to have heartburn as a result of your pregnancy.  You may develop constipation because certain hormones are causing the muscles that push waste through your intestines to slow down.  You may develop hemorrhoids or swollen, bulging veins (varicose veins).  You may have back pain because of the weight gain and pregnancy hormones relaxing your joints between the bones in your pelvis and as a result of a shift in weight and the muscles that support your balance.  Your breasts will continue to grow and be tender.  Your gums may bleed and may be sensitive to brushing and flossing.  Dark spots or blotches (chloasma, mask of pregnancy) may develop on your face. This will likely fade after the baby is born.  A dark line from your belly button to the pubic area (linea nigra) may appear. This will likely fade  after the baby is born.  You may have changes in your hair. These can include thickening of your hair, rapid growth, and changes in texture. Some women also have hair loss during or after pregnancy, or hair that feels dry or thin. Your hair will most likely return to normal after your baby is born. WHAT TO EXPECT AT YOUR PRENATAL VISITS During a routine prenatal visit:  You will be weighed to make sure you and the fetus are growing normally.  Your blood pressure will be taken.  Your abdomen will be measured to track your baby's growth.  The fetal heartbeat will be listened to.  Any test results from the previous visit will be discussed. Your health care provider may ask you:  How you are feeling.  If you are feeling the baby move.  If you have had any abnormal symptoms, such as leaking fluid, bleeding, severe headaches, or abdominal cramping.  If you have any questions. Other tests that may be performed during your second trimester include:  Blood tests that check for:  Low iron levels (anemia).  Gestational diabetes (between 24 and 28 weeks).  Rh antibodies.  Urine tests to check for infections, diabetes, or protein in the urine.  An ultrasound to confirm the proper growth and development of the baby.  An amniocentesis to check for possible genetic problems.  Fetal screens for spina bifida and Down syndrome. HOME CARE INSTRUCTIONS   Avoid all smoking, herbs, alcohol, and unprescribed   drugs. These chemicals affect the formation and growth of the baby.  Follow your health care provider's instructions regarding medicine use. There are medicines that are either safe or unsafe to take during pregnancy.  Exercise only as directed by your health care provider. Experiencing uterine cramps is a good sign to stop exercising.  Continue to eat regular, healthy meals.  Wear a good support bra for breast tenderness.  Do not use hot tubs, steam rooms, or saunas.  Wear your  seat belt at all times when driving.  Avoid raw meat, uncooked cheese, cat litter boxes, and soil used by cats. These carry germs that can cause birth defects in the baby.  Take your prenatal vitamins.  Try taking a stool softener (if your health care provider approves) if you develop constipation. Eat more high-fiber foods, such as fresh vegetables or fruit and whole grains. Drink plenty of fluids to keep your urine clear or pale yellow.  Take warm sitz baths to soothe any pain or discomfort caused by hemorrhoids. Use hemorrhoid cream if your health care provider approves.  If you develop varicose veins, wear support hose. Elevate your feet for 15 minutes, 3-4 times a day. Limit salt in your diet.  Avoid heavy lifting, wear low heel shoes, and practice good posture.  Rest with your legs elevated if you have leg cramps or low back pain.  Visit your dentist if you have not gone yet during your pregnancy. Use a soft toothbrush to brush your teeth and be gentle when you floss.  A sexual relationship may be continued unless your health care provider directs you otherwise.  Continue to go to all your prenatal visits as directed by your health care provider. SEEK MEDICAL CARE IF:   You have dizziness.  You have mild pelvic cramps, pelvic pressure, or nagging pain in the abdominal area.  You have persistent nausea, vomiting, or diarrhea.  You have a bad smelling vaginal discharge.  You have pain with urination. SEEK IMMEDIATE MEDICAL CARE IF:   You have a fever.  You are leaking fluid from your vagina.  You have spotting or bleeding from your vagina.  You have severe abdominal cramping or pain.  You have rapid weight gain or loss.  You have shortness of breath with chest pain.  You notice sudden or extreme swelling of your face, hands, ankles, feet, or legs.  You have not felt your baby move in over an hour.  You have severe headaches that do not go away with  medicine.  You have vision changes. Document Released: 07/27/2001 Document Revised: 08/07/2013 Document Reviewed: 10/03/2012 ExitCare Patient Information 2015 [employer]. This information is not intended to replace advice given to you by your health care provider. Make sure you discuss any questions you have with your health care provider.  

## 2014-03-06 NOTE — Progress Notes (Signed)
New OB.  See SmartSet  Subjective:    Dawn Banks is a [id number] 5144w3d being seen today for her first obstetrical visit.  Her obstetrical history is significant for Late to care, Prior C/S following 2 SVDs. Patient does intend to breast feed. Pregnancy history fully reviewed.  Patient reports no complaints.  Filed Vitals:   03/06/14 0811 03/06/14 0813  BP: 106/59   Pulse: 78   Temp: 98.1 F (36.7 C)   Height:  5\' 1"  (1.549 m)  Weight: 56.972 kg (125 lb 9.6 oz)     HISTORY: OB History  Gravida Para Term Preterm AB SAB TAB Ectopic Multiple Living  4 3 3  0 0 0 0 0 0 3    # Outcome Date GA Lbr Len/2nd Weight Sex Delivery Anes PTL Lv  4 CUR           3 TRM 06/12/10 8867w0d  3.856 kg (8 lb 8 oz) F LTCS Spinal  Y     Comments: breech presentation  2 TRM 04/11/08 7467w0d  3.856 kg (8 lb 8 oz) M SVD None  Y  1 TRM 08/16/02 1967w0d  4.5 kg (9 lb 14.7 oz) M SVD None  Y     History reviewed. No pertinent past medical history. Past Surgical History  Procedure Laterality Date  . Cesarean section     History reviewed. No pertinent family history.   Exam    Uterus:  Fundal Height: 18 cm  Pelvic Exam:    Perineum: No Hemorrhoids, Normal Perineum   Vulva: Bartholin's, Urethra, Skene's normal   Vagina:  normal mucosa, normal discharge   pH:    Cervix: multiparous appearance and no lesions   Adnexa: normal adnexa and no mass, fullness, tenderness   Bony Pelvis: gynecoid  System: Breast:  normal appearance, no masses or tenderness   Skin: normal coloration and turgor, no rashes    Neurologic: oriented, grossly non-focal   Extremities: normal strength, tone, and muscle mass   HEENT neck supple with midline trachea   Mouth/Teeth mucous membranes moist, pharynx normal without lesions   Neck supple and no masses   Cardiovascular: regular rate and rhythm, no murmurs or gallops   Respiratory:  appears well, vitals normal, no respiratory distress, acyanotic, normal RR, ear and  throat exam is normal, neck free of mass or lymphadenopathy, chest clear, no wheezing, crepitations, rhonchi, normal symmetric air entry   Abdomen: soft, non-tender; bowel sounds normal; no masses,  no organomegaly   Urinary: urethral meatus normal      Assessment:    Pregnancy: [id number] Patient Active Problem List   Diagnosis Date Noted  . History of cesarean delivery, antepartum 03/06/2014        Plan:     Initial labs drawn. Prenatal vitamins. Problem list reviewed and updated. Genetic Screening discussed Quad Screen: ordered.  Ultrasound discussed; fetal survey: ordered.  Follow up in 4 weeks. 50% of 30 min visit spent on counseling and coordination of care.   Discussed previous C/S and options of TOLAC vs Repeat C/S.  Risks and benefits discussed. Pt refused interpretor, states she understands.  Elects to proceed with TOLAC.   [facility] N VazquezWILLIAMS,MARIE 03/06/2014

## 2014-03-06 NOTE — Progress Notes (Signed)
Patient reports lower back pain  

## 2014-03-07 ENCOUNTER — Encounter: Payer: Self-pay | Admitting: Advanced Practice Midwife

## 2014-03-07 LAB — AFP, QUAD SCREEN
AFP: 57.2 [IU]/mL
CURR GEST AGE: 17.3 wks.days
Down Syndrome Scr Risk Est: <1:38500 {titer}
HCG, Total: 22774 m[IU]/mL
INH: 237.7 pg/mL
Interpretation-AFP: NEGATIVE
MoM for AFP: 1.49
MoM for INH: 1.19
MoM for hCG: 1.13
Open Spina bifida: NEGATIVE
Osb Risk: 1:2850 {titer}
Tri 18 Scr Risk Est: NEGATIVE
Trisomy 18 (Edward) Syndrome Interp.: <1:120000 {titer}
uE3 Mom: 1.59
uE3 Value: 1.2 ng/mL

## 2014-03-07 LAB — CYTOLOGY - PAP

## 2014-03-15 ENCOUNTER — Ambulatory Visit (HOSPITAL_COMMUNITY)
Admission: RE | Admit: 2014-03-15 | Discharge: 2014-03-15 | Disposition: A | Discharge status: Home / Self Care | Payer: Medicaid Other | Source: Ambulatory Visit | Attending: Advanced Practice Midwife | Admitting: Advanced Practice Midwife

## 2014-03-15 DIAGNOSIS — Z3689 Encounter for other specified antenatal screening: Secondary | ICD-10-CM | POA: Diagnosis not present

## 2014-03-15 DIAGNOSIS — O34219 Maternal care for unspecified type scar from previous cesarean delivery: Secondary | ICD-10-CM | POA: Diagnosis not present

## 2014-03-16 ENCOUNTER — Encounter: Payer: Self-pay | Admitting: Advanced Practice Midwife

## 2014-04-03 ENCOUNTER — Encounter: Payer: Self-pay | Admitting: Advanced Practice Midwife

## 2014-04-03 ENCOUNTER — Ambulatory Visit (INDEPENDENT_AMBULATORY_CARE_PROVIDER_SITE_OTHER): Payer: Medicaid Other | Admitting: Advanced Practice Midwife

## 2014-04-03 VITALS — BP 101/62 | HR 85 | Wt 131.4 lb

## 2014-04-03 DIAGNOSIS — M549 Dorsalgia, unspecified: Secondary | ICD-10-CM

## 2014-04-03 DIAGNOSIS — O34219 Maternal care for unspecified type scar from previous cesarean delivery: Secondary | ICD-10-CM

## 2014-04-03 DIAGNOSIS — O3421 Maternal care for scar from previous cesarean delivery: Secondary | ICD-10-CM

## 2014-04-03 DIAGNOSIS — O9989 Other specified diseases and conditions complicating pregnancy, childbirth and the puerperium: Secondary | ICD-10-CM

## 2014-04-03 LAB — POCT URINALYSIS DIP (DEVICE)
Bilirubin Urine: NEGATIVE
GLUCOSE, UA: NEGATIVE mg/dL
Hgb urine dipstick: NEGATIVE
KETONES UR: NEGATIVE mg/dL
Nitrite: NEGATIVE
Protein, ur: NEGATIVE mg/dL
SPECIFIC GRAVITY, URINE: 1.015 (ref 1.005–1.030)
UROBILINOGEN UA: 0.2 mg/dL (ref 0.0–1.0)
pH: 6.5 (ref 5.0–8.0)

## 2014-04-03 MED ORDER — CYCLOBENZAPRINE HCL 10 MG PO TABS: 5.0000 mg | ORAL_TABLET | Freq: Three times a day (TID) | ORAL | Status: DC | PRN

## 2014-04-03 NOTE — Progress Notes (Signed)
Doing well.  Good fetal movement, denies vaginal bleeding, LOF, regular contractions.  Does report back pain, worse at night when trying to sleep.  Does report back pain in previous pregnancies but this is worse.  Denies urinary symptoms.  Urine sent for culture.  Discussed rest/ice/heat/Tylenol and pregnancy support belt to improve symptoms.  Flexeril 5-10 mg PO TID PRN sent to pharmacy. F/U ultrasound ordered r/t pericardial fluid seen on previous sono.

## 2014-04-03 NOTE — Addendum Note (Signed)
Addended by: LEFTWICH-KIRBY, Abiageal Blowe A on: 04/03/2014 09:30 AM   Modules accepted: Level of Service  

## 2014-04-05 ENCOUNTER — Ambulatory Visit (HOSPITAL_COMMUNITY)
Admission: RE | Admit: 2014-04-05 | Discharge: 2014-04-05 | Disposition: A | Discharge status: Home / Self Care | Payer: Medicaid Other | Source: Ambulatory Visit | Attending: Advanced Practice Midwife | Admitting: Advanced Practice Midwife

## 2014-04-05 DIAGNOSIS — O34219 Maternal care for unspecified type scar from previous cesarean delivery: Secondary | ICD-10-CM

## 2014-04-05 DIAGNOSIS — O358XX0 Maternal care for other (suspected) fetal abnormality and damage, not applicable or unspecified: Secondary | ICD-10-CM | POA: Insufficient documentation

## 2014-04-05 DIAGNOSIS — Z3689 Encounter for other specified antenatal screening: Secondary | ICD-10-CM | POA: Insufficient documentation

## 2014-04-05 LAB — CULTURE, OB URINE

## 2014-05-01 ENCOUNTER — Ambulatory Visit (INDEPENDENT_AMBULATORY_CARE_PROVIDER_SITE_OTHER): Payer: Medicaid Other | Admitting: Physician Assistant

## 2014-05-01 VITALS — BP 95/62 | HR 83 | Temp 98.0°F | Wt 139.0 lb

## 2014-05-01 DIAGNOSIS — Z23 Encounter for immunization: Secondary | ICD-10-CM | POA: Diagnosis not present

## 2014-05-01 DIAGNOSIS — Z3480 Encounter for supervision of other normal pregnancy, unspecified trimester: Secondary | ICD-10-CM | POA: Diagnosis present

## 2014-05-01 DIAGNOSIS — Z3482 Encounter for supervision of other normal pregnancy, second trimester: Secondary | ICD-10-CM

## 2014-05-01 LAB — POCT URINALYSIS DIP (DEVICE)
Bilirubin Urine: NEGATIVE
Glucose, UA: NEGATIVE mg/dL
Hgb urine dipstick: NEGATIVE
Ketones, ur: NEGATIVE mg/dL
Nitrite: NEGATIVE
Protein, ur: NEGATIVE mg/dL
Specific Gravity, Urine: 1.01 (ref 1.005–1.030)
UROBILINOGEN UA: 0.2 mg/dL (ref 0.0–1.0)
pH: 8.5 — ABNORMAL HIGH (ref 5.0–8.0)

## 2014-05-01 NOTE — Progress Notes (Signed)
25 weeks, stable.  No complaints.  Reports good fetal movement.  Denies dysuria, vag bleeding, LOF. RTC 3 weeks Flu vaccine today Cont daily PNV

## 2014-05-01 NOTE — Progress Notes (Signed)
Patient reports back pain

## 2014-05-03 LAB — URINE CULTURE
COLONY COUNT: NO GROWTH
Organism ID, Bacteria: NO GROWTH

## 2014-05-22 ENCOUNTER — Ambulatory Visit (INDEPENDENT_AMBULATORY_CARE_PROVIDER_SITE_OTHER): Payer: Medicaid Other | Admitting: Physician Assistant

## 2014-05-22 ENCOUNTER — Encounter: Payer: Self-pay | Admitting: Physician Assistant

## 2014-05-22 VITALS — BP 107/66 | HR 90 | Wt 146.4 lb

## 2014-05-22 DIAGNOSIS — Z23 Encounter for immunization: Secondary | ICD-10-CM

## 2014-05-22 DIAGNOSIS — Z3483 Encounter for supervision of other normal pregnancy, third trimester: Secondary | ICD-10-CM

## 2014-05-22 DIAGNOSIS — O34219 Maternal care for unspecified type scar from previous cesarean delivery: Secondary | ICD-10-CM

## 2014-05-22 DIAGNOSIS — O3421 Maternal care for scar from previous cesarean delivery: Secondary | ICD-10-CM

## 2014-05-22 LAB — POCT URINALYSIS DIP (DEVICE)
BILIRUBIN URINE: NEGATIVE
Glucose, UA: NEGATIVE mg/dL
Ketones, ur: NEGATIVE mg/dL
NITRITE: NEGATIVE
PROTEIN: NEGATIVE mg/dL
Specific Gravity, Urine: 1.01 (ref 1.005–1.030)
Urobilinogen, UA: 0.2 mg/dL (ref 0.0–1.0)
pH: 6.5 (ref 5.0–8.0)

## 2014-05-22 LAB — CBC
HCT: 26.2 % — ABNORMAL LOW (ref 36.0–46.0)
HEMOGLOBIN: 8.3 g/dL — AB (ref 12.0–15.0)
MCH: 24.9 pg — ABNORMAL LOW (ref 26.0–34.0)
MCHC: 31.7 g/dL (ref 30.0–36.0)
MCV: 78.7 fL (ref 78.0–100.0)
PLATELETS: 130 10*3/uL — AB (ref 150–400)
RBC: 3.33 MIL/uL — ABNORMAL LOW (ref 3.87–5.11)
RDW: 16.4 % — AB (ref 11.5–15.5)
WBC: 8.7 10*3/uL (ref 4.0–10.5)

## 2014-05-22 LAB — RPR

## 2014-05-22 MED ORDER — TETANUS-DIPHTH-ACELL PERTUSSIS 5-2.5-18.5 LF-MCG/0.5 IM SUSP: 0.5000 mL | Freq: Once | INTRAMUSCULAR | Status: DC

## 2014-05-22 MED ORDER — CYCLOBENZAPRINE HCL 10 MG PO TABS: 5.0000 mg | ORAL_TABLET | Freq: Three times a day (TID) | ORAL | Status: DC | PRN

## 2014-05-22 NOTE — Patient Instructions (Signed)
Breastfeeding Deciding to breastfeed is one of the best choices you can make for you and your baby. A change in hormones during pregnancy causes your breast tissue to grow and increases the number and size of your milk ducts. These hormones also allow proteins, sugars, and fats from your blood supply to make breast milk in your milk-producing glands. Hormones prevent breast milk from being released before your baby is born as well as prompt milk flow after birth. Once breastfeeding has begun, thoughts of your baby, as well as his or her sucking or crying, can stimulate the release of milk from your milk-producing glands.  BENEFITS OF BREASTFEEDING For Your Baby  Your first milk (colostrum) helps your baby's digestive system function better.   There are antibodies in your milk that help your baby fight off infections.   Your baby has a lower incidence of asthma, allergies, and sudden infant death syndrome.   The nutrients in breast milk are better for your baby than infant formulas and are designed uniquely for your baby's needs.   Breast milk improves your baby's brain development.   Your baby is less likely to develop other conditions, such as childhood obesity, asthma, or type 2 diabetes mellitus.  For You   Breastfeeding helps to create a very special bond between you and your baby.   Breastfeeding is convenient. Breast milk is always available at the correct temperature and costs nothing.   Breastfeeding helps to burn calories and helps you lose the weight gained during pregnancy.   Breastfeeding makes your uterus contract to its prepregnancy size faster and slows bleeding (lochia) after you give birth.   Breastfeeding helps to lower your risk of developing type 2 diabetes mellitus, osteoporosis, and breast or ovarian cancer later in life. SIGNS THAT YOUR BABY IS HUNGRY Early Signs of Hunger  Increased alertness or activity.  Stretching.  Movement of the head from  side to side.  Movement of the head and opening of the mouth when the corner of the mouth or cheek is stroked (rooting).  Increased sucking sounds, smacking lips, cooing, sighing, or squeaking.  Hand-to-mouth movements.  Increased sucking of fingers or hands. Late Signs of Hunger  Fussing.  Intermittent crying. Extreme Signs of Hunger Signs of extreme hunger will require calming and consoling before your baby will be able to breastfeed successfully. Do not wait for the following signs of extreme hunger to occur before you initiate breastfeeding:   Restlessness.  A loud, strong cry.   Screaming. BREASTFEEDING BASICS Breastfeeding Initiation  Find a comfortable place to sit or lie down, with your neck and back well supported.  Place a pillow or rolled up blanket under your baby to bring him or her to the level of your breast (if you are seated). Nursing pillows are specially designed to help support your arms and your baby while you breastfeed.  Make sure that your baby's abdomen is facing your abdomen.   Gently massage your breast. With your fingertips, massage from your chest wall toward your nipple in a circular motion. This encourages milk flow. You may need to continue this action during the feeding if your milk flows slowly.  Support your breast with 4 fingers underneath and your thumb above your nipple. Make sure your fingers are well away from your nipple and your baby's mouth.   Stroke your baby's lips gently with your finger or nipple.   When your baby's mouth is open wide enough, quickly bring your baby to your   breast, placing your entire nipple and as much of the colored area around your nipple (areola) as possible into your baby's mouth.   More areola should be visible above your baby's upper lip than below the lower lip.   Your baby's tongue should be between his or her lower gum and your breast.   Ensure that your baby's mouth is correctly positioned  around your nipple (latched). Your baby's lips should create a seal on your breast and be turned out (everted).  It is common for your baby to suck about 2-3 minutes in order to start the flow of breast milk. Latching Teaching your baby how to latch on to your breast properly is very important. An improper latch can cause nipple pain and decreased milk supply for you and poor weight gain in your baby. Also, if your baby is not latched onto your nipple properly, he or she may swallow some air during feeding. This can make your baby fussy. Burping your baby when you switch breasts during the feeding can help to get rid of the air. However, teaching your baby to latch on properly is still the best way to prevent fussiness from swallowing air while breastfeeding. Signs that your baby has successfully latched on to your nipple:    Silent tugging or silent sucking, without causing you pain.   Swallowing heard between every 3-4 sucks.    Muscle movement above and in front of his or her ears while sucking.  Signs that your baby has not successfully latched on to nipple:   Sucking sounds or smacking sounds from your baby while breastfeeding.  Nipple pain. If you think your baby has not latched on correctly, slip your finger into the corner of your baby's mouth to break the suction and place it between your baby's gums. Attempt breastfeeding initiation again. Signs of Successful Breastfeeding Signs from your baby:   A gradual decrease in the number of sucks or complete cessation of sucking.   Falling asleep.   Relaxation of his or her body.   Retention of a small amount of milk in his or her mouth.   Letting go of your breast by himself or herself. Signs from you:  Breasts that have increased in firmness, weight, and size 1-3 hours after feeding.   Breasts that are softer immediately after breastfeeding.  Increased milk volume, as well as a change in milk consistency and color by  the fifth day of breastfeeding.   Nipples that are not sore, cracked, or bleeding. Signs That Your Baby is Getting Enough Milk  Wetting at least 3 diapers in a 24-hour period. The urine should be clear and pale yellow by age 5 days.  At least 3 stools in a 24-hour period by age 5 days. The stool should be soft and yellow.  At least 3 stools in a 24-hour period by age 7 days. The stool should be seedy and yellow.  No loss of weight greater than 10% of birth weight during the first 3 days of age.  Average weight gain of 4-7 ounces (113-198 g) per week after age 4 days.  Consistent daily weight gain by age 5 days, without weight loss after the age of 2 weeks. After a feeding, your baby may spit up a small amount. This is common. BREASTFEEDING FREQUENCY AND DURATION Frequent feeding will help you make more milk and can prevent sore nipples and breast engorgement. Breastfeed when you feel the need to reduce the fullness of your breasts   or when your baby shows signs of hunger. This is called "breastfeeding on demand." Avoid introducing a pacifier to your baby while you are working to establish breastfeeding (the first 4-6 weeks after your baby is born). After this time you may choose to use a pacifier. Research has shown that pacifier use during the first year of a baby's life decreases the risk of sudden infant death syndrome (SIDS). Allow your baby to feed on each breast as long as he or she wants. Breastfeed until your baby is finished feeding. When your baby unlatches or falls asleep while feeding from the first breast, offer the second breast. Because newborns are often sleepy in the first few weeks of life, you may need to awaken your baby to get him or her to feed. Breastfeeding times will vary from baby to baby. However, the following rules can serve as a guide to help you ensure that your baby is properly fed:  Newborns (babies 4 weeks of age or younger) may breastfeed every 1-3  hours.  Newborns should not go longer than 3 hours during the day or 5 hours during the night without breastfeeding.  You should breastfeed your baby a minimum of 8 times in a 24-hour period until you begin to introduce solid foods to your baby at around 6 months of age. BREAST MILK PUMPING Pumping and storing breast milk allows you to ensure that your baby is exclusively fed your breast milk, even at times when you are unable to breastfeed. This is especially important if you are going back to work while you are still breastfeeding or when you are not able to be present during feedings. Your lactation consultant can give you guidelines on how long it is safe to store breast milk.  A breast pump is a machine that allows you to pump milk from your breast into a sterile bottle. The pumped breast milk can then be stored in a refrigerator or freezer. Some breast pumps are operated by hand, while others use electricity. Ask your lactation consultant which type will work best for you. Breast pumps can be purchased, but some hospitals and breastfeeding support groups lease breast pumps on a monthly basis. A lactation consultant can teach you how to hand express breast milk, if you prefer not to use a pump.  CARING FOR YOUR BREASTS WHILE YOU BREASTFEED Nipples can become dry, cracked, and sore while breastfeeding. The following recommendations can help keep your breasts moisturized and healthy:  Avoid using soap on your nipples.   Wear a supportive bra. Although not required, special nursing bras and tank tops are designed to allow access to your breasts for breastfeeding without taking off your entire bra or top. Avoid wearing underwire-style bras or extremely tight bras.  Air dry your nipples for 3-4minutes after each feeding.   Use only cotton bra pads to absorb leaked breast milk. Leaking of breast milk between feedings is normal.   Use lanolin on your nipples after breastfeeding. Lanolin helps to  maintain your skin's normal moisture barrier. If you use pure lanolin, you do not need to wash it off before feeding your baby again. Pure lanolin is not toxic to your baby. You may also hand express a few drops of breast milk and gently massage that milk into your nipples and allow the milk to air dry. In the first few weeks after giving birth, some women experience extremely full breasts (engorgement). Engorgement can make your breasts feel heavy, warm, and tender to the   touch. Engorgement peaks within 3-5 days after you give birth. The following recommendations can help ease engorgement:  Completely empty your breasts while breastfeeding or pumping. You may want to start by applying warm, moist heat (in the shower or with warm water-soaked hand towels) just before feeding or pumping. This increases circulation and helps the milk flow. If your baby does not completely empty your breasts while breastfeeding, pump any extra milk after he or she is finished.  Wear a snug bra (nursing or regular) or tank top for 1-2 days to signal your body to slightly decrease milk production.  Apply ice packs to your breasts, unless this is too uncomfortable for you.  Make sure that your baby is latched on and positioned properly while breastfeeding. If engorgement persists after 48 hours of following these recommendations, contact your health care provider or a Advertising copywriterlactation consultant. OVERALL HEALTH CARE RECOMMENDATIONS WHILE BREASTFEEDING  Eat healthy foods. Alternate between meals and snacks, eating 3 of each per day. Because what you eat affects your breast milk, some of the foods may make your baby more irritable than usual. Avoid eating these foods if you are sure that they are negatively affecting your baby.  Drink milk, fruit juice, and water to satisfy your thirst (about 10 glasses a day).   Rest often, relax, and continue to take your prenatal vitamins to prevent fatigue, stress, and anemia.  Continue  breast self-awareness checks.  Avoid chewing and smoking tobacco.  Avoid alcohol and drug use. Some medicines that may be harmful to your baby can pass through breast milk. It is important to ask your health care provider before taking any medicine, including all over-the-counter and prescription medicine as well as vitamin and herbal supplements. It is possible to become pregnant while breastfeeding. If birth control is desired, ask your health care provider about options that will be safe for your baby. SEEK MEDICAL CARE IF:   You feel like you want to stop breastfeeding or have become frustrated with breastfeeding.  You have painful breasts or nipples.  Your nipples are cracked or bleeding.  Your breasts are red, tender, or warm.  You have a swollen area on either breast.  You have a fever or chills.  You have nausea or vomiting.  You have drainage other than breast milk from your nipples.  Your breasts do not become full before feedings by the fifth day after you give birth.  You feel sad and depressed.  Your baby is too sleepy to eat well.  Your baby is having trouble sleeping.   Your baby is wetting less than 3 diapers in a 24-hour period.  Your baby has less than 3 stools in a 24-hour period.  Your baby's skin or the white part of his or her eyes becomes yellow.   Your baby is not gaining weight by 515 days of age. SEEK IMMEDIATE MEDICAL CARE IF:   Your baby is overly tired (lethargic) and does not want to wake up and feed.  Your baby develops an unexplained fever. Document Released: 08/02/2005 Document Revised: 08/07/2013 Document Reviewed: 01/24/2013 [facility] Patient Information 2015 [place], [place]. This information is not intended to replace advice given to you by your health care provider. Make sure you discuss any questions you have with your health care provider. Back Pain in Pregnancy Back pain during pregnancy is common. It happens in about half of all  pregnancies. It is important for you and your baby that you remain active during your pregnancy.If you  feel that back pain is not allowing you to remain active or sleep well, it is time to see your caregiver. Back pain may be caused by several factors related to changes during your pregnancy.Fortunately, unless you had trouble with your back before your pregnancy, the pain is likely to get better after you deliver. Low back pain usually occurs between the fifth and seventh months of pregnancy. It can, however, happen in the first couple months. Factors that increase the risk of back problems include:   Previous back problems.  Injury to your back.  Having twins or multiple births.  A chronic cough.  Stress.  Job-related repetitive motions.  Muscle or spinal disease in the back.  Family history of back problems, ruptured (herniated) discs, or osteoporosis.  Depression, anxiety, and panic attacks. CAUSES   When you are pregnant, your body produces a hormone called relaxin. This hormonemakes the ligaments connecting the low back and pubic bones more flexible. This flexibility allows the baby to be delivered more easily. When your ligaments are loose, your muscles need to work harder to support your back. Soreness in your back can come from tired muscles. Soreness can also come from back tissues that are irritated since they are receiving less support.  As the baby grows, it puts pressure on the nerves and blood vessels in your pelvis. This can cause back pain.  As the baby grows and gets heavier during pregnancy, the uterus pushes the stomach muscles forward and changes your center of gravity. This makes your back muscles work harder to maintain good posture. SYMPTOMS  Lumbar pain during pregnancy Lumbar pain during pregnancy usually occurs at or above the waist in the center of the back. There may be pain and numbness that radiates into your leg or foot. This is similar to low back pain  experienced by non-pregnant women. It usually increases with sitting for long periods of time, standing, or repetitive lifting. Tenderness may also be present in the muscles along your upper back. Posterior pelvic pain during pregnancy Pain in the back of the pelvis is more common than lumbar pain in pregnancy. It is a deep pain felt in your side at the waistline, or across the tailbone (sacrum), or in both places. You may have pain on one or both sides. This pain can also go into the buttocks and backs of the upper thighs. Pubic and groin pain may also be present. The pain does not quickly resolve with rest, and morning stiffness may also be present. Pelvic pain during pregnancy can be brought on by most activities. A high level of fitness before and during pregnancy may or may not prevent this problem. Labor pain is usually 1 to 2 minutes apart, lasts for about 1 minute, and involves a bearing down feeling or pressure in your pelvis. However, if you are at term with the pregnancy, constant low back pain can be the beginning of early labor, and you should be aware of this. DIAGNOSIS  X-rays of the back should not be done during the first 12 to 14 weeks of the pregnancy and only when absolutely necessary during the rest of the pregnancy. MRIs do not give off radiation and are safe during pregnancy. MRIs also should only be done when absolutely necessary. HOME CARE INSTRUCTIONS  Exercise as directed by your caregiver. Exercise is the most effective way to prevent or manage back pain. If you have a back problem, it is especially important to avoid sports that require sudden body  movements. Swimming and walking are great activities.  Do not stand in one place for long periods of time.  Do not wear high heels.  Sit in chairs with good posture. Use a pillow on your lower back if necessary. Make sure your head rests over your shoulders and is not hanging forward.  Try sleeping on your side, preferably the  left side, with a pillow or two between your legs. If you are sore after a night's rest, your bedmay betoo soft.Try placing a board between your mattress and box spring.  Listen to your body when lifting.If you are experiencing pain, ask for help or try bending yourknees more so you can use your leg muscles rather than your back muscles. Squat down when picking up something from the floor. Do not bend over.  Eat a healthy diet. Try to gain weight within your caregiver's recommendations.  Use heat or cold packs 3 to 4 times a day for 15 minutes to help with the pain.  Only take over-the-counter or prescription medicines for pain, discomfort, or fever as directed by your caregiver. Sudden (acute) back pain  Use bed rest for only the most extreme, acute episodes of back pain. Prolonged bed rest over 48 hours will aggravate your condition.  Ice is very effective for acute conditions.  Put ice in a plastic bag.  Place a towel between your skin and the bag.  Leave the ice on for 10 to 20 minutes every 2 hours, or as needed.  Using heat packs for 30 minutes prior to activities is also helpful. Continued back pain See your caregiver if you have continued problems. Your caregiver can help or refer you for appropriate physical therapy. With conditioning, most back problems can be avoided. Sometimes, a more serious issue may be the cause of back pain. You should be seen right away if new problems seem to be developing. Your caregiver may recommend:  A maternity girdle.  An elastic sling.  A back brace.  A massage therapist or acupuncture. SEEK MEDICAL CARE IF:   You are not able to do most of your daily activities, even when taking the pain medicine you were given.  You need a referral to a physical therapist or chiropractor.  You want to try acupuncture. SEEK IMMEDIATE MEDICAL CARE IF:  You develop numbness, tingling, weakness, or problems with the use of your arms or legs.  You  develop severe back pain that is no longer relieved with medicines.  You have a sudden change in bowel or bladder control.  You have increasing pain in other areas of the body.  You develop shortness of breath, dizziness, or fainting.  You develop nausea, vomiting, or sweating.  You have back pain which is similar to labor pains.  You have back pain along with your water breaking or vaginal bleeding.  You have back pain or numbness that travels down your leg.  Your back pain developed after you fell.  You develop pain on one side of your back. You may have a kidney stone.  You see blood in your urine. You may have a bladder infection or kidney stone.  You have back pain with blisters. You may have shingles. Back pain is fairly common during pregnancy but should not be accepted as just part of the process. Back pain should always be treated as soon as possible. This will make your pregnancy as pleasant as possible. Document Released: 11/10/2005 Document Revised: 10/25/2011 Document Reviewed: 12/22/2010 [employer] Patient Information 2015  [employer]. This information is not intended to replace advice given to you by your health care provider. Make sure you discuss any questions you have with your health care provider.

## 2014-05-22 NOTE — Progress Notes (Signed)
28 week labs today.  

## 2014-05-22 NOTE — Progress Notes (Signed)
28 weeks, complaining of back pain at all times but worse at night.  Difficulty sleeping x 2 weeks.  No known injury.  Denies vaginal bleeding/LOF, abdominal pain.  Endorses good fetal movement.  On exam, pt exhibits tenderness over bilateral paraspinous musculature in lower back  GTT, 28 week labs, TDap today Rx for Flexeril 10mg  1/2 to 1 po up to TID PRN back pain.   Sched U/S to follow up pericardial fluid seen previously RTC 2 weeks

## 2014-05-23 LAB — GLUCOSE TOLERANCE, 1 HOUR (50G) W/O FASTING: Glucose, 1 Hour GTT: 163 mg/dL — ABNORMAL HIGH (ref 70–140)

## 2014-05-23 LAB — HIV ANTIBODY (ROUTINE TESTING W REFLEX): HIV 1&2 Ab, 4th Generation: NONREACTIVE

## 2014-05-27 ENCOUNTER — Ambulatory Visit (HOSPITAL_COMMUNITY)
Admission: RE | Admit: 2014-05-27 | Discharge: 2014-05-27 | Disposition: A | Discharge status: Home / Self Care | Payer: Medicaid Other | Source: Ambulatory Visit | Attending: Physician Assistant | Admitting: Physician Assistant

## 2014-05-27 DIAGNOSIS — Z3A29 29 weeks gestation of pregnancy: Secondary | ICD-10-CM

## 2014-05-27 DIAGNOSIS — O3421 Maternal care for scar from previous cesarean delivery: Secondary | ICD-10-CM | POA: Diagnosis not present

## 2014-05-27 DIAGNOSIS — O358XX0 Maternal care for other (suspected) fetal abnormality and damage, not applicable or unspecified: Secondary | ICD-10-CM | POA: Insufficient documentation

## 2014-05-27 DIAGNOSIS — O359XX0 Maternal care for (suspected) fetal abnormality and damage, unspecified, not applicable or unspecified: Secondary | ICD-10-CM

## 2014-05-27 DIAGNOSIS — Z3483 Encounter for supervision of other normal pregnancy, third trimester: Secondary | ICD-10-CM

## 2014-05-27 DIAGNOSIS — O34219 Maternal care for unspecified type scar from previous cesarean delivery: Secondary | ICD-10-CM

## 2014-06-05 ENCOUNTER — Ambulatory Visit (INDEPENDENT_AMBULATORY_CARE_PROVIDER_SITE_OTHER): Payer: Medicaid Other | Admitting: Advanced Practice Midwife

## 2014-06-05 VITALS — BP 118/69 | HR 94 | Temp 98.8°F | Wt 147.8 lb

## 2014-06-05 DIAGNOSIS — M7918 Myalgia, other site: Secondary | ICD-10-CM

## 2014-06-05 DIAGNOSIS — O99810 Abnormal glucose complicating pregnancy: Secondary | ICD-10-CM

## 2014-06-05 DIAGNOSIS — M791 Myalgia: Secondary | ICD-10-CM

## 2014-06-05 DIAGNOSIS — O3421 Maternal care for scar from previous cesarean delivery: Secondary | ICD-10-CM

## 2014-06-05 DIAGNOSIS — O34219 Maternal care for unspecified type scar from previous cesarean delivery: Secondary | ICD-10-CM

## 2014-06-05 LAB — POCT URINALYSIS DIP (DEVICE)
Bilirubin Urine: NEGATIVE
Glucose, UA: NEGATIVE mg/dL
Hgb urine dipstick: NEGATIVE
KETONES UR: NEGATIVE mg/dL
Nitrite: NEGATIVE
PH: 5.5 (ref 5.0–8.0)
PROTEIN: NEGATIVE mg/dL
Specific Gravity, Urine: 1.015 (ref 1.005–1.030)
Urobilinogen, UA: 0.2 mg/dL (ref 0.0–1.0)

## 2014-06-05 MED ORDER — TRAMADOL HCL 50 MG PO TABS: 100.0000 mg | ORAL_TABLET | Freq: Four times a day (QID) | ORAL | Status: DC | PRN

## 2014-06-05 NOTE — Progress Notes (Signed)
I was present for the exam and agree with above.  

## 2014-06-05 NOTE — Progress Notes (Signed)
[facility] in left armpit and left leg.   Pain on left side  [employer] interpreter # [phone] Reported to pt abnormal glucose and the need for 3hr.  Pt informed me that she has not eaten or drinken except a sip of water at 0400.  3hr glucose given today.

## 2014-06-05 NOTE — Patient Instructions (Signed)
Glucose Tolerance Test This is a test to see how your body processes carbohydrates. This test is often done to check patients for diabetes or the possibility of developing it. PREPARATION FOR TEST You should have nothing to eat or drink 12 hours before the test. You will be given a form of sugar (glucose) and then blood samples will be drawn from your vein to determine the level of sugar in your blood. Alternatively, blood may be drawn from your finger for testing. You should not smoke or exercise during the test. NORMAL FINDINGS Fasting: 70-115 mg/dL 30 minutes: less than 147200 mg/dL 1 hour: less than 829200 mg/dL 2 hours: less than 562140 mg/dL 3 hours: 13-08670-115 mg/dL 4 hours: 57-84670-115 mg/dL Ranges for normal findings may vary among different laboratories and hospitals. You should always check with your doctor after having lab work or other tests done to discuss the meaning of your test results and whether your values are considered within normal limits. MEANING OF TEST Your caregiver will go over the test results with you and discuss the importance and meaning of your results, as well as treatment options and the need for additional tests. OBTAINING THE TEST RESULTS It is your responsibility to obtain your test results. Ask the lab or department performing the test when and how you will get your results. Document Released: 08/25/2004 Document Revised: 10/25/2011 Document Reviewed: 12/07/2013 [facility] Patient Information 2015 [place], [place]. This information is not intended to replace advice given to you by your health care provider. Make sure you discuss any questions you have with your health care provider. Third Trimester of Pregnancy The third trimester is from week 29 through week 42, months 7 through 9. The third trimester is a time when the fetus is growing rapidly. At the end of the ninth month, the fetus is about 20 inches in length and weighs 6-10 pounds.  BODY CHANGES Your body goes through  many changes during pregnancy. The changes vary from woman to woman.   Your weight will continue to increase. You can expect to gain 25-35 pounds (11-16 kg) by the end of the pregnancy.  You may begin to get stretch marks on your hips, abdomen, and breasts.  You may urinate more often because the fetus is moving lower into your pelvis and pressing on your bladder.  You may develop or continue to have heartburn as a result of your pregnancy.  You may develop constipation because certain hormones are causing the muscles that push waste through your intestines to slow down.  You may develop hemorrhoids or swollen, bulging veins (varicose veins).  You may have pelvic pain because of the weight gain and pregnancy hormones relaxing your joints between the bones in your pelvis. Backaches may result from overexertion of the muscles supporting your posture.  You may have changes in your hair. These can include thickening of your hair, rapid growth, and changes in texture. Some women also have hair loss during or after pregnancy, or hair that feels dry or thin. Your hair will most likely return to normal after your baby is born.  Your breasts will continue to grow and be tender. A yellow discharge may leak from your breasts called colostrum.  Your belly button may stick out.  You may feel short of breath because of your expanding uterus.  You may notice the fetus "dropping," or moving lower in your abdomen.  You may have a bloody mucus discharge. This usually occurs a few days to a week before  labor begins.  Your cervix becomes thin and soft (effaced) near your due date. WHAT TO EXPECT AT YOUR PRENATAL EXAMS  You will have prenatal exams every 2 weeks until week 36. Then, you will have weekly prenatal exams. During a routine prenatal visit:  You will be weighed to make sure you and the fetus are growing normally.  Your blood pressure is taken.  Your abdomen will be measured to track your  baby's growth.  The fetal heartbeat will be listened to.  Any test results from the previous visit will be discussed.  You may have a cervical check near your due date to see if you have effaced. At around 36 weeks, your caregiver will check your cervix. At the same time, your caregiver will also perform a test on the secretions of the vaginal tissue. This test is to determine if a type of bacteria, Group B streptococcus, is present. Your caregiver will explain this further. Your caregiver may ask you:  What your birth plan is.  How you are feeling.  If you are feeling the baby move.  If you have had any abnormal symptoms, such as leaking fluid, bleeding, severe headaches, or abdominal cramping.  If you have any questions. Other tests or screenings that may be performed during your third trimester include:  Blood tests that check for low iron levels (anemia).  Fetal testing to check the health, activity level, and growth of the fetus. Testing is done if you have certain medical conditions or if there are problems during the pregnancy. FALSE LABOR You may feel small, irregular contractions that eventually go away. These are called Braxton Hicks contractions, or false labor. Contractions may last for hours, days, or even weeks before true labor sets in. If contractions come at regular intervals, intensify, or become painful, it is best to be seen by your caregiver.  SIGNS OF LABOR   Menstrual-like cramps.  Contractions that are 5 minutes apart or less.  Contractions that start on the top of the uterus and spread down to the lower abdomen and back.  A sense of increased pelvic pressure or back pain.  A watery or bloody mucus discharge that comes from the vagina. If you have any of these signs before the 37th week of pregnancy, call your caregiver right away. You need to go to the hospital to get checked immediately. HOME CARE INSTRUCTIONS   Avoid all smoking, herbs, alcohol, and  unprescribed drugs. These chemicals affect the formation and growth of the baby.  Follow your caregiver's instructions regarding medicine use. There are medicines that are either safe or unsafe to take during pregnancy.  Exercise only as directed by your caregiver. Experiencing uterine cramps is a good sign to stop exercising.  Continue to eat regular, healthy meals.  Wear a good support bra for breast tenderness.  Do not use hot tubs, steam rooms, or saunas.  Wear your seat belt at all times when driving.  Avoid raw meat, uncooked cheese, cat litter boxes, and soil used by cats. These carry germs that can cause birth defects in the baby.  Take your prenatal vitamins.  Try taking a stool softener (if your caregiver approves) if you develop constipation. Eat more high-fiber foods, such as fresh vegetables or fruit and whole grains. Drink plenty of fluids to keep your urine clear or pale yellow.  Take warm sitz baths to soothe any pain or discomfort caused by hemorrhoids. Use hemorrhoid cream if your caregiver approves.  If you develop varicose  veins, wear support hose. Elevate your feet for 15 minutes, 3-4 times a day. Limit salt in your diet.  Avoid heavy lifting, wear low heal shoes, and practice good posture.  Rest a lot with your legs elevated if you have leg cramps or low back pain.  Visit your dentist if you have not gone during your pregnancy. Use a soft toothbrush to brush your teeth and be gentle when you floss.  A sexual relationship may be continued unless your caregiver directs you otherwise.  Do not travel far distances unless it is absolutely necessary and only with the approval of your caregiver.  Take prenatal classes to understand, practice, and ask questions about the labor and delivery.  Make a trial run to the hospital.  Pack your hospital bag.  Prepare the baby's nursery.  Continue to go to all your prenatal visits as directed by your caregiver. SEEK  MEDICAL CARE IF:  You are unsure if you are in labor or if your water has broken.  You have dizziness.  You have mild pelvic cramps, pelvic pressure, or nagging pain in your abdominal area.  You have persistent nausea, vomiting, or diarrhea.  You have a bad smelling vaginal discharge.  You have pain with urination. SEEK IMMEDIATE MEDICAL CARE IF:   You have a fever.  You are leaking fluid from your vagina.  You have spotting or bleeding from your vagina.  You have severe abdominal cramping or pain.  You have rapid weight loss or gain.  You have shortness of breath with chest pain.  You notice sudden or extreme swelling of your face, hands, ankles, feet, or legs.  You have not felt your baby move in over an hour.  You have severe headaches that do not go away with medicine.  You have vision changes. Document Released: 07/27/2001 Document Revised: 08/07/2013 Document Reviewed: 10/03/2012 [facility]wallExitCare Patient Information 2015 [place]ExitCare, [place]LLC. This information is not intended to replace advice given to you by your health care provider. Make sure you discuss any questions you have with your health care provider.

## 2014-06-05 NOTE — Progress Notes (Signed)
Reports continue to have some pain with breathing and movement in left armpit, side and left leg since 06/02/14 night.  Prescription for Ultram given today.  [facility], request of medical records form signed today.  No SOB noted at present. No swelling, discoloration or other abnormalities seen at this time.  Fundal height slightly over, will continue to monitor at next visit and assess possible need for growth US.  Completing 3 hr glucose test today.  Denies any vaginal bleeding, cramping or LOF.  Reports good fetal movements.  Urine culture sent today for large amt of leukocytes and reports of some pain with urination. Return in 2 weeks.

## 2014-06-06 ENCOUNTER — Encounter: Payer: Self-pay | Admitting: Advanced Practice Midwife

## 2014-06-06 DIAGNOSIS — O99810 Abnormal glucose complicating pregnancy: Secondary | ICD-10-CM | POA: Insufficient documentation

## 2014-06-06 LAB — GLUCOSE TOLERANCE, 3 HOURS
GLUCOSE 3 HOUR GTT: 150 mg/dL — AB (ref 70–144)
Glucose Tolerance, 1 hour: 188 mg/dL (ref 70–189)
Glucose Tolerance, 2 hour: 163 mg/dL (ref 70–164)
Glucose Tolerance, Fasting: 81 mg/dL (ref 70–104)

## 2014-06-07 ENCOUNTER — Telehealth: Payer: Self-pay

## 2014-06-07 NOTE — Telephone Encounter (Signed)
Called pt and left message that I am calling with results and to see if she can come in on Monday morning for a consult to please give us a return call back.

## 2014-06-07 NOTE — Telephone Encounter (Signed)
Message copied by Faythe CasaBELLAMY, Chalet Kerwin M on Fri Jun 07, 2014  9:32 AM ------      Message from: [place], [place]      Created: Thu Jun 06, 2014  8:28 PM       Abnormal 3 hour GTT>>GDM. [facility]. ------

## 2014-06-10 ENCOUNTER — Telehealth: Payer: Self-pay

## 2014-06-10 ENCOUNTER — Ambulatory Visit: Payer: Medicaid Other

## 2014-06-10 NOTE — Telephone Encounter (Signed)
Appointment made with Diabetic educator for this morning at 0930. Informed Harriett Sineancy not available today for diabetic education. Appointment changed to Thursday at 1430 at Maternal Fetal Medicine. Called patient with pacific interpreter [id number] and spoke to husband who states cannot make that appointment because he is at work with the car-- patient can only do morning appointments. No morning appointments in MFM. Appointment for diabetic education scheduled for Monday 06/17/14 at 0900. Husband states patient will be at appointment. Message sent to admin pool to add patient to schedule.

## 2014-06-10 NOTE — Telephone Encounter (Signed)
Patient's husband returned call on behalf of patient. Requests call back.   Attempted to contact patient regarding message on Friday [facility] [phone]. Patient's husband answered and stated patient is unavailable at the moment and asked to take a message. Asked if patient would be available to come into clinic this morning at 0930 for education. Husband stated he could bring her at 0930. Patient put on schedule for diabetic education.

## 2014-06-13 ENCOUNTER — Ambulatory Visit (HOSPITAL_COMMUNITY): Payer: Medicaid Other

## 2014-06-17 ENCOUNTER — Encounter: Payer: Self-pay | Admitting: Physician Assistant

## 2014-06-19 ENCOUNTER — Encounter: Payer: Self-pay | Admitting: Obstetrics and Gynecology

## 2014-06-19 ENCOUNTER — Ambulatory Visit (INDEPENDENT_AMBULATORY_CARE_PROVIDER_SITE_OTHER): Payer: Medicaid Other | Admitting: Obstetrics and Gynecology

## 2014-06-19 VITALS — BP 107/63 | HR 101 | Temp 98.1°F | Wt 149.2 lb

## 2014-06-19 DIAGNOSIS — Z8632 Personal history of gestational diabetes: Secondary | ICD-10-CM | POA: Insufficient documentation

## 2014-06-19 DIAGNOSIS — O24419 Gestational diabetes mellitus in pregnancy, unspecified control: Secondary | ICD-10-CM

## 2014-06-19 DIAGNOSIS — O99810 Abnormal glucose complicating pregnancy: Secondary | ICD-10-CM

## 2014-06-19 LAB — POCT URINALYSIS DIP (DEVICE)
Bilirubin Urine: NEGATIVE
GLUCOSE, UA: NEGATIVE mg/dL
Hgb urine dipstick: NEGATIVE
Ketones, ur: NEGATIVE mg/dL
Nitrite: NEGATIVE
PROTEIN: NEGATIVE mg/dL
SPECIFIC GRAVITY, URINE: 1.01 (ref 1.005–1.030)
Urobilinogen, UA: 0.2 mg/dL (ref 0.0–1.0)
pH: 6.5 (ref 5.0–8.0)

## 2014-06-19 NOTE — Progress Notes (Signed)
MSK and RLP discussed. Chloasma> avoid sun. Fetal pericardial fluid> resolved (physiologic) Explained GDM dx and management plan. Will try to get in diabetic teaching and start monitoring tomorrow. Hx LGA and accelerated growth by US 3 wks ago. Schedule interval growth scan. Folllow HRC.

## 2014-06-19 NOTE — Patient Instructions (Signed)
Gestational Diabetes Mellitus Gestational diabetes mellitus, often simply referred to as gestational diabetes, is a type of diabetes that some women develop during pregnancy. In gestational diabetes, the pancreas does not make enough insulin (a hormone), the cells are less responsive to the insulin that is made (insulin resistance), or both.Normally, insulin moves sugars from food into the tissue cells. The tissue cells use the sugars for energy. The lack of insulin or the lack of normal response to insulin causes excess sugars to build up in the blood instead of going into the tissue cells. As a result, high blood sugar (hyperglycemia) develops. The effect of high sugar (glucose) levels can cause many problems.  RISK FACTORS You have an increased chance of developing gestational diabetes if you have a family history of diabetes and also have one or more of the following risk factors:  A body mass index over 30 (obesity).  A previous pregnancy with gestational diabetes.  An older age at the time of pregnancy. If blood glucose levels are kept in the normal range during pregnancy, women can have a healthy pregnancy. If your blood glucose levels are not well controlled, there may be risks to you, your unborn baby (fetus), your labor and delivery, or your newborn baby.  SYMPTOMS  If symptoms are experienced, they are much like symptoms you would normally expect during pregnancy. The symptoms of gestational diabetes include:   Increased thirst (polydipsia).  Increased urination (polyuria).  Increased urination during the night (nocturia).  Weight loss. This weight loss may be rapid.  Frequent, recurring infections.  Tiredness (fatigue).  Weakness.  Vision changes, such as blurred vision.  Fruity smell to your breath.  Abdominal pain. DIAGNOSIS Diabetes is diagnosed when blood glucose levels are increased. Your blood glucose level may be checked by one or more of the following blood  tests:  A fasting blood glucose test. You will not be allowed to eat for at least 8 hours before a blood sample is taken.  A random blood glucose test. Your blood glucose is checked at any time of the day regardless of when you ate.  A hemoglobin A1c blood glucose test. A hemoglobin A1c test provides information about blood glucose control over the previous 3 months.  An oral glucose tolerance test (OGTT). Your blood glucose is measured after you have not eaten (fasted) for 1-3 hours and then after you drink a glucose-containing beverage. Since the hormones that cause insulin resistance are highest at about 24-28 weeks of a pregnancy, an OGTT is usually performed during that time. If you have risk factors for gestational diabetes, your health care provider may test you for gestational diabetes earlier than 24 weeks of pregnancy. TREATMENT   You will need to take diabetes medicine or insulin daily to keep blood glucose levels in the desired range.  You will need to match insulin dosing with exercise and healthy food choices. The treatment goal is to maintain the before-meal (preprandial), bedtime, and overnight blood glucose level at 60-99 mg/dL during pregnancy. The treatment goal is to further maintain peak after-meal blood sugar (postprandial glucose) level at 100-140 mg/dL. HOME CARE INSTRUCTIONS   Have your hemoglobin A1c level checked twice a year.  Perform daily blood glucose monitoring as directed by your health care provider. It is common to perform frequent blood glucose monitoring.  Monitor urine ketones when you are ill and as directed by your health care provider.  Take your diabetes medicine and insulin as directed by your health care provider   to maintain your blood glucose level in the desired range.  Never run out of diabetes medicine or insulin. It is needed every day.  Adjust insulin based on your intake of carbohydrates. Carbohydrates can raise blood glucose levels but  need to be included in your diet. Carbohydrates provide vitamins, minerals, and fiber which are an essential part of a healthy diet. Carbohydrates are found in fruits, vegetables, whole grains, dairy products, legumes, and foods containing added sugars.  Eat healthy foods. Alternate 3 meals with 3 snacks.  Maintain a healthy weight gain. The usual total expected weight gain varies according to your prepregnancy body mass index (BMI).  Carry a medical alert card or wear your medical alert jewelry.  Carry a 15-gram carbohydrate snack with you at all times to treat low blood glucose (hypoglycemia). Some examples of 15-gram carbohydrate snacks include:  Glucose tablets, 3 or 4.  Glucose gel, 15-gram tube.  Raisins, 2 tablespoons (24 g).  Jelly beans, 6.  Animal crackers, 8.  Fruit juice, regular soda, or low-fat milk, 4 ounces (120 mL).  Gummy treats, 9.  Recognize hypoglycemia. Hypoglycemia during pregnancy occurs with blood glucose levels of 60 mg/dL and below. The risk for hypoglycemia increases when fasting or skipping meals, during or after intense exercise, and during sleep. Hypoglycemia symptoms can include:  Tremors or shakes.  Decreased ability to concentrate.  Sweating.  Increased heart rate.  Headache.  Dry mouth.  Hunger.  Irritability.  Anxiety.  Restless sleep.  Altered speech or coordination.  Confusion.  Treat hypoglycemia promptly. If you are alert and able to safely swallow, follow the 15:15 rule:  Take 15-20 grams of rapid-acting glucose or carbohydrate. Rapid-acting options include glucose gel, glucose tablets, or 4 ounces (120 mL) of fruit juice, regular soda, or low-fat milk.  Check your blood glucose level 15 minutes after taking the glucose.  Take 15-20 grams more of glucose if the repeat blood glucose level is still 70 mg/dL or below.  Eat a meal or snack within 1 hour once blood glucose levels return to normal.  Be alert to polyuria  (excess urination) and polydipsia (excess thirst) which are early signs of hyperglycemia. An early awareness of hyperglycemia allows for prompt treatment. Treat hyperglycemia as directed by your health care provider.  Engage in at least 30 minutes of physical activity a day or as directed by your health care provider. Ten minutes of physical activity timed 30 minutes after each meal is encouraged to control postprandial blood glucose levels.  Adjust your insulin dosing and food intake as needed if you start a new exercise or sport.  Follow your sick-day plan at any time you are unable to eat or drink as usual.  Avoid tobacco and alcohol use.  Keep all follow-up visits as directed by your health care provider.  Follow the advice of your health care provider regarding your prenatal and post-delivery (postpartum) appointments, meal planning, exercise, medicines, vitamins, blood tests, other medical tests, and physical activities.  Perform daily skin and foot care. Examine your skin and feet daily for cuts, bruises, redness, nail problems, bleeding, blisters, or sores.  Brush your teeth and gums at least twice a day and floss at least once a day. Follow up with your dentist regularly.  Schedule an eye exam during the first trimester of your pregnancy or as directed by your health care provider.  Share your diabetes management plan with your workplace or school.  Stay up-to-date with immunizations.  Learn to manage stress.    Obtain ongoing diabetes education and support as needed.  Learn about and consider breastfeeding your baby.  You should have your blood sugar level checked 6-12 weeks after delivery. This is done with an oral glucose tolerance test (OGTT). SEEK MEDICAL CARE IF:   You are unable to eat food or drink fluids for more than 6 hours.  You have nausea and vomiting for more than 6 hours.  You have a blood glucose level of 200 mg/dL and you have ketones in your  urine.  There is a change in mental status.  You develop vision problems.  You have a persistent headache.  You have upper abdominal pain or discomfort.  You develop an additional serious illness.  You have diarrhea for more than 6 hours.  You have been sick or have had a fever for a couple of days and are not getting better. SEEK IMMEDIATE MEDICAL CARE IF:   You have difficulty breathing.  You no longer feel the baby moving.  You are bleeding or have discharge from your vagina.  You start having premature contractions or labor. MAKE SURE YOU:  Understand these instructions.  Will watch your condition.  Will get help right away if you are not doing well or get worse. Document Released: 11/08/2000 Document Revised: 12/17/2013 Document Reviewed: 02/29/2012 ExitCare Patient Information 2015 [employer], [employer]. This information is not intended to replace advice given to you by your health care provider. Make sure you discuss any questions you have with your health care provider.  

## 2014-06-19 NOTE — Progress Notes (Signed)
Patient reports lower back pain  

## 2014-06-20 ENCOUNTER — Ambulatory Visit (HOSPITAL_COMMUNITY)
Admission: RE | Admit: 2014-06-20 | Discharge: 2014-06-20 | Disposition: A | Discharge status: Home / Self Care | Payer: Medicaid Other | Source: Ambulatory Visit | Attending: Advanced Practice Midwife | Admitting: Advanced Practice Midwife

## 2014-06-24 ENCOUNTER — Ambulatory Visit (INDEPENDENT_AMBULATORY_CARE_PROVIDER_SITE_OTHER): Payer: Medicaid Other | Admitting: Obstetrics & Gynecology

## 2014-06-24 ENCOUNTER — Ambulatory Visit (HOSPITAL_COMMUNITY)
Admission: RE | Admit: 2014-06-24 | Discharge: 2014-06-24 | Disposition: A | Discharge status: Home / Self Care | Payer: Medicaid Other | Source: Ambulatory Visit | Attending: Obstetrics and Gynecology | Admitting: Obstetrics and Gynecology

## 2014-06-24 ENCOUNTER — Encounter: Payer: Medicaid Other | Attending: Obstetrics & Gynecology | Admitting: *Deleted

## 2014-06-24 VITALS — BP 103/65 | HR 92 | Temp 97.7°F | Wt 148.3 lb

## 2014-06-24 DIAGNOSIS — Z3A33 33 weeks gestation of pregnancy: Secondary | ICD-10-CM | POA: Insufficient documentation

## 2014-06-24 DIAGNOSIS — O3421 Maternal care for scar from previous cesarean delivery: Secondary | ICD-10-CM | POA: Diagnosis not present

## 2014-06-24 DIAGNOSIS — O24410 Gestational diabetes mellitus in pregnancy, diet controlled: Secondary | ICD-10-CM | POA: Diagnosis present

## 2014-06-24 DIAGNOSIS — O358XX0 Maternal care for other (suspected) fetal abnormality and damage, not applicable or unspecified: Secondary | ICD-10-CM | POA: Insufficient documentation

## 2014-06-24 DIAGNOSIS — O24420 Gestational diabetes mellitus in childbirth, diet controlled: Secondary | ICD-10-CM | POA: Diagnosis not present

## 2014-06-24 DIAGNOSIS — Z713 Dietary counseling and surveillance: Secondary | ICD-10-CM | POA: Diagnosis not present

## 2014-06-24 DIAGNOSIS — O24419 Gestational diabetes mellitus in pregnancy, unspecified control: Secondary | ICD-10-CM | POA: Insufficient documentation

## 2014-06-24 LAB — POCT URINALYSIS DIP (DEVICE)
Bilirubin Urine: NEGATIVE
Glucose, UA: 250 mg/dL — AB
Hgb urine dipstick: NEGATIVE
KETONES UR: NEGATIVE mg/dL
Nitrite: NEGATIVE
Protein, ur: NEGATIVE mg/dL
Specific Gravity, Urine: 1.02 (ref 1.005–1.030)
Urobilinogen, UA: 1 mg/dL (ref 0.0–1.0)
pH: 6 (ref 5.0–8.0)

## 2014-06-24 LAB — GLUCOSE, CAPILLARY: GLUCOSE-CAPILLARY: 137 mg/dL — AB (ref 70–99)

## 2014-06-24 NOTE — Progress Notes (Signed)
Patient presents with only 3 readings in her log book. She stated that the machine was not working properly. Patient performed test with me without difficulty.  Machine/technique appropriate via return demonstration. Patient able to communicate without difficulty through this encounter.

## 2014-06-24 NOTE — Progress Notes (Signed)
Pt has not checked cbgs due to the monitor "not working"  Will have educator review with her in detail.  No other complaints.  CBG approx 1 1/2  hours pp is 137.

## 2014-06-24 NOTE — Progress Notes (Signed)
Occasional edema in feet.  [employer] interpreter [id number] used for this encounter.

## 2014-07-08 ENCOUNTER — Encounter: Payer: Self-pay | Admitting: Obstetrics and Gynecology

## 2014-07-08 ENCOUNTER — Ambulatory Visit (INDEPENDENT_AMBULATORY_CARE_PROVIDER_SITE_OTHER): Payer: Medicaid Other | Admitting: Obstetrics and Gynecology

## 2014-07-08 VITALS — BP 108/67 | HR 84 | Temp 97.7°F | Wt 152.5 lb

## 2014-07-08 DIAGNOSIS — O34219 Maternal care for unspecified type scar from previous cesarean delivery: Secondary | ICD-10-CM

## 2014-07-08 DIAGNOSIS — O3421 Maternal care for scar from previous cesarean delivery: Secondary | ICD-10-CM

## 2014-07-08 DIAGNOSIS — O24419 Gestational diabetes mellitus in pregnancy, unspecified control: Secondary | ICD-10-CM

## 2014-07-08 LAB — POCT URINALYSIS DIP (DEVICE)
Bilirubin Urine: NEGATIVE
GLUCOSE, UA: NEGATIVE mg/dL
HGB URINE DIPSTICK: NEGATIVE
KETONES UR: NEGATIVE mg/dL
Nitrite: NEGATIVE
Protein, ur: NEGATIVE mg/dL
SPECIFIC GRAVITY, URINE: 1.01 (ref 1.005–1.030)
Urobilinogen, UA: 0.2 mg/dL (ref 0.0–1.0)
pH: 5.5 (ref 5.0–8.0)

## 2014-07-08 NOTE — Progress Notes (Signed)
Patient is doing well without complaints. FM/PTL precautions reviewed. CBGs all within range. Congratulated patient on her efforts. Cultures and schedule growth ultrasound at next visit [employer] interpreter used for this encounter

## 2014-07-15 ENCOUNTER — Ambulatory Visit (INDEPENDENT_AMBULATORY_CARE_PROVIDER_SITE_OTHER): Payer: Medicaid Other | Admitting: Obstetrics and Gynecology

## 2014-07-15 ENCOUNTER — Other Ambulatory Visit: Payer: Self-pay | Admitting: Obstetrics and Gynecology

## 2014-07-15 VITALS — BP 109/67 | HR 77 | Temp 97.6°F | Wt 149.0 lb

## 2014-07-15 DIAGNOSIS — O34219 Maternal care for unspecified type scar from previous cesarean delivery: Secondary | ICD-10-CM

## 2014-07-15 DIAGNOSIS — O24419 Gestational diabetes mellitus in pregnancy, unspecified control: Secondary | ICD-10-CM

## 2014-07-15 DIAGNOSIS — Z3493 Encounter for supervision of normal pregnancy, unspecified, third trimester: Secondary | ICD-10-CM

## 2014-07-15 DIAGNOSIS — O3421 Maternal care for scar from previous cesarean delivery: Secondary | ICD-10-CM

## 2014-07-15 LAB — POCT URINALYSIS DIP (DEVICE)
Bilirubin Urine: NEGATIVE
Glucose, UA: NEGATIVE mg/dL
Ketones, ur: NEGATIVE mg/dL
Nitrite: NEGATIVE
PH: 5 (ref 5.0–8.0)
Protein, ur: NEGATIVE mg/dL
Specific Gravity, Urine: <=1.005 (ref 1.005–1.030)
Urobilinogen, UA: 0.2 mg/dL (ref 0.0–1.0)

## 2014-07-15 LAB — OB RESULTS CONSOLE GC/CHLAMYDIA
Chlamydia: NEGATIVE
GC PROBE AMP, GENITAL: NEGATIVE

## 2014-07-15 LAB — OB RESULTS CONSOLE GBS: STREP GROUP B AG: NEGATIVE

## 2014-07-15 NOTE — Progress Notes (Signed)
Doing well today, no complaints.  1. A1GDM. Reviewed log. All CBGs within range. Growth ultrasound ordered.  2. Routine PNC. 36 week cultures obtained today. FM/PTL precautions reviewed. RTC in 1 week.

## 2014-07-15 NOTE — Progress Notes (Signed)
36 wk cultures today, pt complains of left hip pain since last night

## 2014-07-16 LAB — GC/CHLAMYDIA PROBE AMP
CT Probe RNA: NEGATIVE
GC Probe RNA: NEGATIVE

## 2014-07-17 LAB — CULTURE, BETA STREP (GROUP B ONLY)

## 2014-07-22 ENCOUNTER — Encounter: Payer: Self-pay | Admitting: Obstetrics & Gynecology

## 2014-07-22 ENCOUNTER — Ambulatory Visit (INDEPENDENT_AMBULATORY_CARE_PROVIDER_SITE_OTHER): Payer: Medicaid Other | Admitting: Obstetrics & Gynecology

## 2014-07-22 VITALS — BP 105/76 | HR 89 | Temp 97.7°F | Wt 152.5 lb

## 2014-07-22 DIAGNOSIS — O3421 Maternal care for scar from previous cesarean delivery: Secondary | ICD-10-CM

## 2014-07-22 DIAGNOSIS — O34219 Maternal care for unspecified type scar from previous cesarean delivery: Secondary | ICD-10-CM

## 2014-07-22 LAB — POCT URINALYSIS DIP (DEVICE)
Bilirubin Urine: NEGATIVE
GLUCOSE, UA: NEGATIVE mg/dL
Hgb urine dipstick: NEGATIVE
Ketones, ur: NEGATIVE mg/dL
Nitrite: NEGATIVE
Protein, ur: NEGATIVE mg/dL
UROBILINOGEN UA: 0.2 mg/dL (ref 0.0–1.0)
pH: 5.5 (ref 5.0–8.0)

## 2014-07-22 NOTE — Progress Notes (Signed)
Growth U/S with [facility] 08/05/14 @ 1030a.  VBAC consent reviewed, signature obtained. [place]igrinya phone interpretation with Ryland Groupatalino Spinaci.

## 2014-07-22 NOTE — Progress Notes (Signed)
All CBGs are WNL. Pt wants TOL.  Needs VBAC consent signed.  RN read consent to her with interpreter Kennyth Lose([place]).  Needs US for growth around 39 weeks.

## 2014-07-22 NOTE — Progress Notes (Signed)
Interputer Matalino Guardian Life InsuranceStinaci

## 2014-07-24 ENCOUNTER — Encounter: Payer: Self-pay | Admitting: *Deleted

## 2014-07-29 ENCOUNTER — Ambulatory Visit (INDEPENDENT_AMBULATORY_CARE_PROVIDER_SITE_OTHER): Payer: Medicaid Other | Admitting: Family Medicine

## 2014-07-29 ENCOUNTER — Encounter: Payer: Self-pay | Admitting: Family Medicine

## 2014-07-29 ENCOUNTER — Encounter: Payer: Self-pay | Admitting: *Deleted

## 2014-07-29 VITALS — BP 105/70 | HR 87 | Temp 98.6°F | Wt 152.0 lb

## 2014-07-29 DIAGNOSIS — O24419 Gestational diabetes mellitus in pregnancy, unspecified control: Secondary | ICD-10-CM

## 2014-07-29 DIAGNOSIS — O24410 Gestational diabetes mellitus in pregnancy, diet controlled: Secondary | ICD-10-CM

## 2014-07-29 DIAGNOSIS — O0930 Supervision of pregnancy with insufficient antenatal care, unspecified trimester: Secondary | ICD-10-CM

## 2014-07-29 DIAGNOSIS — O3421 Maternal care for scar from previous cesarean delivery: Secondary | ICD-10-CM

## 2014-07-29 DIAGNOSIS — O34219 Maternal care for unspecified type scar from previous cesarean delivery: Secondary | ICD-10-CM

## 2014-07-29 LAB — POCT URINALYSIS DIP (DEVICE)
BILIRUBIN URINE: NEGATIVE
Glucose, UA: NEGATIVE mg/dL
Hgb urine dipstick: NEGATIVE
Ketones, ur: NEGATIVE mg/dL
NITRITE: NEGATIVE
PH: 7 (ref 5.0–8.0)
Protein, ur: NEGATIVE mg/dL
Specific Gravity, Urine: 1.01 (ref 1.005–1.030)
UROBILINOGEN UA: 0.2 mg/dL (ref 0.0–1.0)

## 2014-07-29 NOTE — Patient Instructions (Signed)
Gestational Diabetes Mellitus Gestational diabetes mellitus, often simply referred to as gestational diabetes, is a type of diabetes that some women develop during pregnancy. In gestational diabetes, the pancreas does not make enough insulin (a hormone), the cells are less responsive to the insulin that is made (insulin resistance), or both.Normally, insulin moves sugars from food into the tissue cells. The tissue cells use the sugars for energy. The lack of insulin or the lack of normal response to insulin causes excess sugars to build up in the blood instead of going into the tissue cells. As a result, high blood sugar (hyperglycemia) develops. The effect of high sugar (glucose) levels can cause many problems.  RISK FACTORS You have an increased chance of developing gestational diabetes if you have a family history of diabetes and also have one or more of the following risk factors:  A body mass index over 30 (obesity).  A previous pregnancy with gestational diabetes.  An older age at the time of pregnancy. If blood glucose levels are kept in the normal range during pregnancy, women can have a healthy pregnancy. If your blood glucose levels are not well controlled, there may be risks to you, your unborn baby (fetus), your labor and delivery, or your newborn baby.  SYMPTOMS  If symptoms are experienced, they are much like symptoms you would normally expect during pregnancy. The symptoms of gestational diabetes include:   Increased thirst (polydipsia).  Increased urination (polyuria).  Increased urination during the night (nocturia).  Weight loss. This weight loss may be rapid.  Frequent, recurring infections.  Tiredness (fatigue).  Weakness.  Vision changes, such as blurred vision.  Fruity smell to your breath.  Abdominal pain. DIAGNOSIS Diabetes is diagnosed when blood glucose levels are increased. Your blood glucose level may be checked by one or more of the following blood  tests:  A fasting blood glucose test. You will not be allowed to eat for at least 8 hours before a blood sample is taken.  A random blood glucose test. Your blood glucose is checked at any time of the day regardless of when you ate.  A hemoglobin A1c blood glucose test. A hemoglobin A1c test provides information about blood glucose control over the previous 3 months.  An oral glucose tolerance test (OGTT). Your blood glucose is measured after you have not eaten (fasted) for 1-3 hours and then after you drink a glucose-containing beverage. Since the hormones that cause insulin resistance are highest at about 24-28 weeks of a pregnancy, an OGTT is usually performed during that time. If you have risk factors for gestational diabetes, your health care provider may test you for gestational diabetes earlier than 24 weeks of pregnancy. TREATMENT   You will need to take diabetes medicine or insulin daily to keep blood glucose levels in the desired range.  You will need to match insulin dosing with exercise and healthy food choices. The treatment goal is to maintain the before-meal (preprandial), bedtime, and overnight blood glucose level at 60-99 mg/dL during pregnancy. The treatment goal is to further maintain peak after-meal blood sugar (postprandial glucose) level at 100-140 mg/dL. HOME CARE INSTRUCTIONS   Have your hemoglobin A1c level checked twice a year.  Perform daily blood glucose monitoring as directed by your health care provider. It is common to perform frequent blood glucose monitoring.  Monitor urine ketones when you are ill and as directed by your health care provider.  Take your diabetes medicine and insulin as directed by your health care provider   to maintain your blood glucose level in the desired range.  Never run out of diabetes medicine or insulin. It is needed every day.  Adjust insulin based on your intake of carbohydrates. Carbohydrates can raise blood glucose levels but  need to be included in your diet. Carbohydrates provide vitamins, minerals, and fiber which are an essential part of a healthy diet. Carbohydrates are found in fruits, vegetables, whole grains, dairy products, legumes, and foods containing added sugars.  Eat healthy foods. Alternate 3 meals with 3 snacks.  Maintain a healthy weight gain. The usual total expected weight gain varies according to your prepregnancy body mass index (BMI).  Carry a medical alert card or wear your medical alert jewelry.  Carry a 15-gram carbohydrate snack with you at all times to treat low blood glucose (hypoglycemia). Some examples of 15-gram carbohydrate snacks include:  Glucose tablets, 3 or 4.  Glucose gel, 15-gram tube.  Raisins, 2 tablespoons (24 g).  Jelly beans, 6.  Animal crackers, 8.  Fruit juice, regular soda, or low-fat milk, 4 ounces (120 mL).  Gummy treats, 9.  Recognize hypoglycemia. Hypoglycemia during pregnancy occurs with blood glucose levels of 60 mg/dL and below. The risk for hypoglycemia increases when fasting or skipping meals, during or after intense exercise, and during sleep. Hypoglycemia symptoms can include:  Tremors or shakes.  Decreased ability to concentrate.  Sweating.  Increased heart rate.  Headache.  Dry mouth.  Hunger.  Irritability.  Anxiety.  Restless sleep.  Altered speech or coordination.  Confusion.  Treat hypoglycemia promptly. If you are alert and able to safely swallow, follow the 15:15 rule:  Take 15-20 grams of rapid-acting glucose or carbohydrate. Rapid-acting options include glucose gel, glucose tablets, or 4 ounces (120 mL) of fruit juice, regular soda, or low-fat milk.  Check your blood glucose level 15 minutes after taking the glucose.  Take 15-20 grams more of glucose if the repeat blood glucose level is still 70 mg/dL or below.  Eat a meal or snack within 1 hour once blood glucose levels return to normal.  Be alert to polyuria  (excess urination) and polydipsia (excess thirst) which are early signs of hyperglycemia. An early awareness of hyperglycemia allows for prompt treatment. Treat hyperglycemia as directed by your health care provider.  Engage in at least 30 minutes of physical activity a day or as directed by your health care provider. Ten minutes of physical activity timed 30 minutes after each meal is encouraged to control postprandial blood glucose levels.  Adjust your insulin dosing and food intake as needed if you start a new exercise or sport.  Follow your sick-day plan at any time you are unable to eat or drink as usual.  Avoid tobacco and alcohol use.  Keep all follow-up visits as directed by your health care provider.  Follow the advice of your health care provider regarding your prenatal and post-delivery (postpartum) appointments, meal planning, exercise, medicines, vitamins, blood tests, other medical tests, and physical activities.  Perform daily skin and foot care. Examine your skin and feet daily for cuts, bruises, redness, nail problems, bleeding, blisters, or sores.  Brush your teeth and gums at least twice a day and floss at least once a day. Follow up with your dentist regularly.  Schedule an eye exam during the first trimester of your pregnancy or as directed by your health care provider.  Share your diabetes management plan with your workplace or school.  Stay up-to-date with immunizations.  Learn to manage stress.    Obtain ongoing diabetes education and support as needed.  Learn about and consider breastfeeding your baby.  You should have your blood sugar level checked 6-12 weeks after delivery. This is done with an oral glucose tolerance test (OGTT). SEEK MEDICAL CARE IF:   You are unable to eat food or drink fluids for more than 6 hours.  You have nausea and vomiting for more than 6 hours.  You have a blood glucose level of 200 mg/dL and you have ketones in your  urine.  There is a change in mental status.  You develop vision problems.  You have a persistent headache.  You have upper abdominal pain or discomfort.  You develop an additional serious illness.  You have diarrhea for more than 6 hours.  You have been sick or have had a fever for a couple of days and are not getting better. SEEK IMMEDIATE MEDICAL CARE IF:   You have difficulty breathing.  You no longer feel the baby moving.  You are bleeding or have discharge from your vagina.  You start having premature contractions or labor. MAKE SURE YOU:  Understand these instructions.  Will watch your condition.  Will get help right away if you are not doing well or get worse. Document Released: 11/08/2000 Document Revised: 12/17/2013 Document Reviewed: 02/29/2012 [employer] Patient Information 2015 [employer]. This information is not intended to replace advice given to you by your health care provider. Make sure you discuss any questions you have with your health care provider.  Breastfeeding Deciding to breastfeed is one of the best choices you can make for you and your baby. A change in hormones during pregnancy causes your breast tissue to grow and increases the number and size of your milk ducts. These hormones also allow proteins, sugars, and fats from your blood supply to make breast milk in your milk-producing glands. Hormones prevent breast milk from being released before your baby is born as well as prompt milk flow after birth. Once breastfeeding has begun, thoughts of your baby, as well as his or her sucking or crying, can stimulate the release of milk from your milk-producing glands.  BENEFITS OF BREASTFEEDING For Your Baby  Your first milk (colostrum) helps your baby's digestive system function better.   There are antibodies in your milk that help your baby fight off infections.   Your baby has a lower incidence of asthma, allergies, and sudden infant death  syndrome.   The nutrients in breast milk are better for your baby than infant formulas and are designed uniquely for your baby's needs.   Breast milk improves your baby's brain development.   Your baby is less likely to develop other conditions, such as childhood obesity, asthma, or type 2 diabetes mellitus.  For You   Breastfeeding helps to create a very special bond between you and your baby.   Breastfeeding is convenient. Breast milk is always available at the correct temperature and costs nothing.   Breastfeeding helps to burn calories and helps you lose the weight gained during pregnancy.   Breastfeeding makes your uterus contract to its prepregnancy size faster and slows bleeding (lochia) after you give birth.   Breastfeeding helps to lower your risk of developing type 2 diabetes mellitus, osteoporosis, and breast or ovarian cancer later in life. SIGNS THAT YOUR BABY IS HUNGRY Early Signs of Hunger  Increased alertness or activity.  Stretching.  Movement of the head from side to side.  Movement of the head and opening of the   mouth when the corner of the mouth or cheek is stroked (rooting).  Increased sucking sounds, smacking lips, cooing, sighing, or squeaking.  Hand-to-mouth movements.  Increased sucking of fingers or hands. Late Signs of Hunger  Fussing.  Intermittent crying. Extreme Signs of Hunger Signs of extreme hunger will require calming and consoling before your baby will be able to breastfeed successfully. Do not wait for the following signs of extreme hunger to occur before you initiate breastfeeding:   Restlessness.  A loud, strong cry.   Screaming. BREASTFEEDING BASICS Breastfeeding Initiation  Find a comfortable place to sit or lie down, with your neck and back well supported.  Place a pillow or rolled up blanket under your baby to bring him or her to the level of your breast (if you are seated). Nursing pillows are specially designed  to help support your arms and your baby while you breastfeed.  Make sure that your baby's abdomen is facing your abdomen.   Gently massage your breast. With your fingertips, massage from your chest wall toward your nipple in a circular motion. This encourages milk flow. You may need to continue this action during the feeding if your milk flows slowly.  Support your breast with 4 fingers underneath and your thumb above your nipple. Make sure your fingers are well away from your nipple and your baby's mouth.   Stroke your baby's lips gently with your finger or nipple.   When your baby's mouth is open wide enough, quickly bring your baby to your breast, placing your entire nipple and as much of the colored area around your nipple (areola) as possible into your baby's mouth.   More areola should be visible above your baby's upper lip than below the lower lip.   Your baby's tongue should be between his or her lower gum and your breast.   Ensure that your baby's mouth is correctly positioned around your nipple (latched). Your baby's lips should create a seal on your breast and be turned out (everted).  It is common for your baby to suck about 2-3 minutes in order to start the flow of breast milk. Latching Teaching your baby how to latch on to your breast properly is very important. An improper latch can cause nipple pain and decreased milk supply for you and poor weight gain in your baby. Also, if your baby is not latched onto your nipple properly, he or she may swallow some air during feeding. This can make your baby fussy. Burping your baby when you switch breasts during the feeding can help to get rid of the air. However, teaching your baby to latch on properly is still the best way to prevent fussiness from swallowing air while breastfeeding. Signs that your baby has successfully latched on to your nipple:    Silent tugging or silent sucking, without causing you pain.   Swallowing  heard between every 3-4 sucks.    Muscle movement above and in front of his or her ears while sucking.  Signs that your baby has not successfully latched on to nipple:   Sucking sounds or smacking sounds from your baby while breastfeeding.  Nipple pain. If you think your baby has not latched on correctly, slip your finger into the corner of your baby's mouth to break the suction and place it between your baby's gums. Attempt breastfeeding initiation again. Signs of Successful Breastfeeding Signs from your baby:   A gradual decrease in the number of sucks or complete cessation of sucking.     Falling asleep.   Relaxation of his or her body.   Retention of a small amount of milk in his or her mouth.   Letting go of your breast by himself or herself. Signs from you:  Breasts that have increased in firmness, weight, and size 1-3 hours after feeding.   Breasts that are softer immediately after breastfeeding.  Increased milk volume, as well as a change in milk consistency and color by the fifth day of breastfeeding.   Nipples that are not sore, cracked, or bleeding. Signs That Your Baby is Getting Enough Milk  Wetting at least 3 diapers in a 24-hour period. The urine should be clear and pale yellow by age 5 days.  At least 3 stools in a 24-hour period by age 5 days. The stool should be soft and yellow.  At least 3 stools in a 24-hour period by age 7 days. The stool should be seedy and yellow.  No loss of weight greater than 10% of birth weight during the first 3 days of age.  Average weight gain of 4-7 ounces (113-198 g) per week after age 4 days.  Consistent daily weight gain by age 5 days, without weight loss after the age of 2 weeks. After a feeding, your baby may spit up a small amount. This is common. BREASTFEEDING FREQUENCY AND DURATION Frequent feeding will help you make more milk and can prevent sore nipples and breast engorgement. Breastfeed when you feel the  need to reduce the fullness of your breasts or when your baby shows signs of hunger. This is called "breastfeeding on demand." Avoid introducing a pacifier to your baby while you are working to establish breastfeeding (the first 4-6 weeks after your baby is born). After this time you may choose to use a pacifier. Research has shown that pacifier use during the first year of a baby's life decreases the risk of sudden infant death syndrome (SIDS). Allow your baby to feed on each breast as long as he or she wants. Breastfeed until your baby is finished feeding. When your baby unlatches or falls asleep while feeding from the first breast, offer the second breast. Because newborns are often sleepy in the first few weeks of life, you may need to awaken your baby to get him or her to feed. Breastfeeding times will vary from baby to baby. However, the following rules can serve as a guide to help you ensure that your baby is properly fed:  Newborns (babies 4 weeks of age or younger) may breastfeed every 1-3 hours.  Newborns should not go longer than 3 hours during the day or 5 hours during the night without breastfeeding.  You should breastfeed your baby a minimum of 8 times in a 24-hour period until you begin to introduce solid foods to your baby at around 6 months of age. BREAST MILK PUMPING Pumping and storing breast milk allows you to ensure that your baby is exclusively fed your breast milk, even at times when you are unable to breastfeed. This is especially important if you are going back to work while you are still breastfeeding or when you are not able to be present during feedings. Your lactation consultant can give you guidelines on how long it is safe to store breast milk.  A breast pump is a machine that allows you to pump milk from your breast into a sterile bottle. The pumped breast milk can then be stored in a refrigerator or freezer. Some breast pumps are operated by   hand, while others use  electricity. Ask your lactation consultant which type will work best for you. Breast pumps can be purchased, but some hospitals and breastfeeding support groups lease breast pumps on a monthly basis. A lactation consultant can teach you how to hand express breast milk, if you prefer not to use a pump.  CARING FOR YOUR BREASTS WHILE YOU BREASTFEED Nipples can become dry, cracked, and sore while breastfeeding. The following recommendations can help keep your breasts moisturized and healthy:  Avoid using soap on your nipples.   Wear a supportive bra. Although not required, special nursing bras and tank tops are designed to allow access to your breasts for breastfeeding without taking off your entire bra or top. Avoid wearing underwire-style bras or extremely tight bras.  Air dry your nipples for 3-4minutes after each feeding.   Use only cotton bra pads to absorb leaked breast milk. Leaking of breast milk between feedings is normal.   Use lanolin on your nipples after breastfeeding. Lanolin helps to maintain your skin's normal moisture barrier. If you use pure lanolin, you do not need to wash it off before feeding your baby again. Pure lanolin is not toxic to your baby. You may also hand express a few drops of breast milk and gently massage that milk into your nipples and allow the milk to air dry. In the first few weeks after giving birth, some women experience extremely full breasts (engorgement). Engorgement can make your breasts feel heavy, warm, and tender to the touch. Engorgement peaks within 3-5 days after you give birth. The following recommendations can help ease engorgement:  Completely empty your breasts while breastfeeding or pumping. You may want to start by applying warm, moist heat (in the shower or with warm water-soaked hand towels) just before feeding or pumping. This increases circulation and helps the milk flow. If your baby does not completely empty your breasts while  breastfeeding, pump any extra milk after he or she is finished.  Wear a snug bra (nursing or regular) or tank top for 1-2 days to signal your body to slightly decrease milk production.  Apply ice packs to your breasts, unless this is too uncomfortable for you.  Make sure that your baby is latched on and positioned properly while breastfeeding. If engorgement persists after 48 hours of following these recommendations, contact your health care provider or a lactation consultant. OVERALL HEALTH CARE RECOMMENDATIONS WHILE BREASTFEEDING  Eat healthy foods. Alternate between meals and snacks, eating 3 of each per day. Because what you eat affects your breast milk, some of the foods may make your baby more irritable than usual. Avoid eating these foods if you are sure that they are negatively affecting your baby.  Drink milk, fruit juice, and water to satisfy your thirst (about 10 glasses a day).   Rest often, relax, and continue to take your prenatal vitamins to prevent fatigue, stress, and anemia.  Continue breast self-awareness checks.  Avoid chewing and smoking tobacco.  Avoid alcohol and drug use. Some medicines that may be harmful to your baby can pass through breast milk. It is important to ask your health care provider before taking any medicine, including all over-the-counter and prescription medicine as well as vitamin and herbal supplements. It is possible to become pregnant while breastfeeding. If birth control is desired, ask your health care provider about options that will be safe for your baby. SEEK MEDICAL CARE IF:   You feel like you want to stop breastfeeding or have become   frustrated with breastfeeding.  You have painful breasts or nipples.  Your nipples are cracked or bleeding.  Your breasts are red, tender, or warm.  You have a swollen area on either breast.  You have a fever or chills.  You have nausea or vomiting.  You have drainage other than breast milk from  your nipples.  Your breasts do not become full before feedings by the fifth day after you give birth.  You feel sad and depressed.  Your baby is too sleepy to eat well.  Your baby is having trouble sleeping.   Your baby is wetting less than 3 diapers in a 24-hour period.  Your baby has less than 3 stools in a 24-hour period.  Your baby's skin or the white part of his or her eyes becomes yellow.   Your baby is not gaining weight by 5 days of age. SEEK IMMEDIATE MEDICAL CARE IF:   Your baby is overly tired (lethargic) and does not want to wake up and feed.  Your baby develops an unexplained fever. Document Released: 08/02/2005 Document Revised: 08/07/2013 Document Reviewed: 01/24/2013 [employer] Patient Information 2015 [employer]. This information is not intended to replace advice given to you by your health care provider. Make sure you discuss any questions you have with your health care provider.  Trial of Labor After Cesarean Delivery Information A trial of labor after cesarean delivery (TOLAC) is when a woman tries to give birth vaginally after a previous cesarean delivery. TOLAC may be a safe and appropriate option for you depending on your medical history and other risk factors. When TOLAC is successful and you are able to have a vaginal delivery, this is called a vaginal birth after cesarean delivery (VBAC).  CANDIDATES FOR TOLAC TOLAC is possible for some women who:  Have undergone one or two prior cesarean deliveries in which the incision of the uterus was horizontal (low transverse).  Are carrying twins and have had one prior low transverse incision during a cesarean delivery.  Do not have a vertical (classical) uterine scar.  Have not had a tear in the wall of their uterus (uterine rupture). TOLAC is also supported for women who meet appropriate criteria and:  Are under the age of 40 years.  Are tall and have a body mass index (BMI) of less than 30.  Have an  unknown uterine scar.  Give birth in a facility equipped to handle an emergency cesarean delivery. This team should be able to handle possible complications such as a uterine rupture.  Have thorough counseling about the benefits and risks of TOLAC.  Have discussed future pregnancy plans with their health care provider.  Plan to have several more pregnancies. MOST SUCCESSFUL CANDIDATES FOR TOLAC:  Have had a successful vaginal delivery before or after their cesarean delivery.  Experience labor that begins naturally on or before the due date (40 weeks of gestation).  Do not have a very large (macrosomic) baby.   Had a prior cesarean delivery but are not currently experiencing factors that would prompt a cesarean delivery (such as a breech position).  Had only one prior cesarean delivery.  Had a prior cesarean delivery that was performed early in labor and not after full cervical dilation. TOLAC may be most appropriate for women who meet the above guidelines and who plan to have more pregnancies. TOLAC is not recommended for home births. LEAST SUCCESSFUL CANDIDATES FOR TOLAC:  Have an induced labor with an unfavorable cervix. An unfavorable cervix is when   the cervix is not dilating enough (among other factors).  Have never had a vaginal delivery.  Have had more than two cesarean deliveries.  Have a pregnancy at more than 40 weeks of gestation.  Are pregnant with a baby with a suspected weight greater than 4,000 grams (8 pounds) and who have no prior history of a vaginal delivery.  Have closely spaced pregnancies. SUGGESTED BENEFITS OF TOLAC  You may have a faster recovery time.  You may have a shorter stay in the hospital.  You may have less pain and fewer problems than with a cesarean delivery. Women who have a cesarean delivery have a higher chance of needing blood or getting a fever, an infection, or a blood clot in the legs. SUGGESTED RISKS OF TOLAC The highest risk of  complications happens to women who attempt a TOLAC and fail. A failed TOLAC results in an unplanned cesarean delivery. Risks related to TOLAC or repeat cesarean deliveries include:   Blood loss.  Infection.  Blood clot.  Injury to surrounding tissues or organs.  Having to remove the uterus (hysterectomy).  Potential problems with the placenta (such as placenta previa or placenta accreta) in future pregnancies. Although very rare, the main concerns with TOLAC are:  Rupture of the uterine scar from a past cesarean delivery.  Needing an emergency cesarean delivery.  Having a bad outcome for the baby (perinatal morbidity). FOR MORE INFORMATION [employer] of Obstetricians [employer] Gynecologists: www.acog.org American College of Nurse-Midwives: www.midwife.org Document Released: 04/20/2011 Document Revised: 05/23/2013 Document Reviewed: 01/22/2013 ExitCare Patient Information 2015 [employer]. This information is not intended to replace advice given to you by your health care provider. Make sure you discuss any questions you have with your health care provider.  

## 2014-07-29 NOTE — Progress Notes (Signed)
Growth U/S with [facility] 07/30/14 @ 1130a.  IOL 08/11/14 @ 730p.

## 2014-07-29 NOTE — Progress Notes (Signed)
Used [employer]# [id number].

## 2014-07-29 NOTE — Progress Notes (Signed)
[employer] interpreter used. FBS 62-69 2 hr pp 78-110 Checked meter--numbers are good. U/S growth this week IOL at 40 wks.

## 2014-07-30 ENCOUNTER — Encounter (HOSPITAL_COMMUNITY): Payer: Self-pay

## 2014-07-30 ENCOUNTER — Ambulatory Visit (HOSPITAL_COMMUNITY)
Admission: RE | Admit: 2014-07-30 | Discharge: 2014-07-30 | Disposition: A | Discharge status: Home / Self Care | Payer: Medicaid Other | Source: Ambulatory Visit | Attending: Obstetrics and Gynecology | Admitting: Obstetrics and Gynecology

## 2014-07-30 DIAGNOSIS — O34219 Maternal care for unspecified type scar from previous cesarean delivery: Secondary | ICD-10-CM | POA: Insufficient documentation

## 2014-07-30 DIAGNOSIS — O24419 Gestational diabetes mellitus in pregnancy, unspecified control: Secondary | ICD-10-CM | POA: Diagnosis not present

## 2014-07-30 DIAGNOSIS — Z3A38 38 weeks gestation of pregnancy: Secondary | ICD-10-CM | POA: Insufficient documentation

## 2014-07-30 DIAGNOSIS — O3421 Maternal care for scar from previous cesarean delivery: Secondary | ICD-10-CM | POA: Insufficient documentation

## 2014-07-30 DIAGNOSIS — Z3493 Encounter for supervision of normal pregnancy, unspecified, third trimester: Secondary | ICD-10-CM

## 2014-07-30 DIAGNOSIS — O24410 Gestational diabetes mellitus in pregnancy, diet controlled: Secondary | ICD-10-CM | POA: Insufficient documentation

## 2014-08-05 ENCOUNTER — Ambulatory Visit (HOSPITAL_COMMUNITY): Payer: Medicaid Other

## 2014-08-06 ENCOUNTER — Inpatient Hospital Stay (HOSPITAL_COMMUNITY)
Admission: AD | Admit: 2014-08-06 | Discharge: 2014-08-07 | Disposition: A | Discharge status: Home / Self Care | Payer: Medicaid Other | Source: Ambulatory Visit | Attending: Family Medicine | Admitting: Family Medicine

## 2014-08-06 ENCOUNTER — Encounter (HOSPITAL_COMMUNITY): Payer: Self-pay | Admitting: *Deleted

## 2014-08-06 DIAGNOSIS — Z3A34 34 weeks gestation of pregnancy: Secondary | ICD-10-CM | POA: Insufficient documentation

## 2014-08-06 DIAGNOSIS — O99810 Abnormal glucose complicating pregnancy: Secondary | ICD-10-CM

## 2014-08-06 DIAGNOSIS — R109 Unspecified abdominal pain: Secondary | ICD-10-CM | POA: Insufficient documentation

## 2014-08-06 DIAGNOSIS — O9989 Other specified diseases and conditions complicating pregnancy, childbirth and the puerperium: Secondary | ICD-10-CM | POA: Insufficient documentation

## 2014-08-06 DIAGNOSIS — O34219 Maternal care for unspecified type scar from previous cesarean delivery: Secondary | ICD-10-CM

## 2014-08-06 NOTE — MAU Note (Signed)
PT  SAYS   HURT  BAD  SINCE 12NOON.    [facility]   [facility] -  ?    GBS-  NEG.    DENIES HSV AND MRSA.

## 2014-08-07 ENCOUNTER — Inpatient Hospital Stay (HOSPITAL_COMMUNITY)
Admission: AD | Admit: 2014-08-07 | Discharge: 2014-08-09 | DRG: 775 | Disposition: A | Discharge status: Home / Self Care | Payer: Medicaid Other | Source: Ambulatory Visit | Attending: Obstetrics and Gynecology | Admitting: Obstetrics and Gynecology

## 2014-08-07 ENCOUNTER — Inpatient Hospital Stay (HOSPITAL_COMMUNITY): Payer: Medicaid Other | Admitting: Anesthesiology

## 2014-08-07 ENCOUNTER — Ambulatory Visit (INDEPENDENT_AMBULATORY_CARE_PROVIDER_SITE_OTHER): Payer: Medicaid Other | Admitting: Physician Assistant

## 2014-08-07 ENCOUNTER — Encounter (HOSPITAL_COMMUNITY): Payer: Self-pay | Admitting: General Practice

## 2014-08-07 VITALS — BP 126/88 | HR 84 | Temp 98.5°F | Wt 155.7 lb

## 2014-08-07 DIAGNOSIS — O3421 Maternal care for scar from previous cesarean delivery: Secondary | ICD-10-CM | POA: Diagnosis present

## 2014-08-07 DIAGNOSIS — Z3A39 39 weeks gestation of pregnancy: Secondary | ICD-10-CM | POA: Diagnosis present

## 2014-08-07 DIAGNOSIS — O9989 Other specified diseases and conditions complicating pregnancy, childbirth and the puerperium: Secondary | ICD-10-CM | POA: Diagnosis present

## 2014-08-07 DIAGNOSIS — Z3483 Encounter for supervision of other normal pregnancy, third trimester: Secondary | ICD-10-CM

## 2014-08-07 DIAGNOSIS — IMO0001 Reserved for inherently not codable concepts without codable children: Secondary | ICD-10-CM

## 2014-08-07 DIAGNOSIS — O24410 Gestational diabetes mellitus in pregnancy, diet controlled: Secondary | ICD-10-CM | POA: Diagnosis present

## 2014-08-07 DIAGNOSIS — Z3A34 34 weeks gestation of pregnancy: Secondary | ICD-10-CM | POA: Diagnosis not present

## 2014-08-07 DIAGNOSIS — R109 Unspecified abdominal pain: Secondary | ICD-10-CM | POA: Diagnosis not present

## 2014-08-07 LAB — CBC
HCT: 26.5 % — ABNORMAL LOW (ref 36.0–46.0)
Hemoglobin: 7.9 g/dL — ABNORMAL LOW (ref 12.0–15.0)
MCH: 19.9 pg — AB (ref 26.0–34.0)
MCHC: 29.8 g/dL — ABNORMAL LOW (ref 30.0–36.0)
MCV: 66.8 fL — ABNORMAL LOW (ref 78.0–100.0)
Platelets: 102 10*3/uL — ABNORMAL LOW (ref 150–400)
RBC: 3.97 MIL/uL (ref 3.87–5.11)
RDW: 21.4 % — ABNORMAL HIGH (ref 11.5–15.5)
WBC: 8.3 10*3/uL (ref 4.0–10.5)

## 2014-08-07 LAB — POCT URINALYSIS DIP (DEVICE)
Bilirubin Urine: NEGATIVE
Glucose, UA: NEGATIVE mg/dL
KETONES UR: NEGATIVE mg/dL
NITRITE: NEGATIVE
PROTEIN: NEGATIVE mg/dL
Specific Gravity, Urine: <=1.005 (ref 1.005–1.030)
UROBILINOGEN UA: 0.2 mg/dL (ref 0.0–1.0)
pH: 6.5 (ref 5.0–8.0)

## 2014-08-07 LAB — GLUCOSE, CAPILLARY
GLUCOSE-CAPILLARY: 72 mg/dL (ref 70–99)
GLUCOSE-CAPILLARY: 75 mg/dL (ref 70–99)
Glucose-Capillary: 62 mg/dL — ABNORMAL LOW (ref 70–99)

## 2014-08-07 LAB — TYPE AND SCREEN
ABO/RH(D): O POS
ANTIBODY SCREEN: NEGATIVE

## 2014-08-07 LAB — RPR

## 2014-08-07 MED ORDER — LIDOCAINE HCL (PF) 1 % IJ SOLN
INTRAMUSCULAR | Status: DC | PRN
  Administered 2014-08-07 (×2): 5 mL

## 2014-08-07 MED ORDER — FLEET ENEMA 7-19 GM/118ML RE ENEM: 1.0000 | ENEMA | RECTAL | Status: DC | PRN

## 2014-08-07 MED ORDER — LACTATED RINGERS IV SOLN
500.0000 mL | Freq: Once | INTRAVENOUS | Status: AC
  Administered 2014-08-07: 500 mL via INTRAVENOUS

## 2014-08-07 MED ORDER — PHENYLEPHRINE 40 MCG/ML (10ML) SYRINGE FOR IV PUSH (FOR BLOOD PRESSURE SUPPORT)
80.0000 ug | PREFILLED_SYRINGE | INTRAVENOUS | Status: DC | PRN
  Filled 2014-08-07: qty 2

## 2014-08-07 MED ORDER — DIPHENHYDRAMINE HCL 50 MG/ML IJ SOLN: 12.5000 mg | INTRAMUSCULAR | Status: DC | PRN

## 2014-08-07 MED ORDER — TERBUTALINE SULFATE 1 MG/ML IJ SOLN: 0.2500 mg | Freq: Once | INTRAMUSCULAR | Status: AC | PRN

## 2014-08-07 MED ORDER — LACTATED RINGERS IV SOLN
INTRAVENOUS | Status: DC
  Administered 2014-08-07 (×2): via INTRAVENOUS

## 2014-08-07 MED ORDER — OXYTOCIN BOLUS FROM INFUSION
500.0000 mL | INTRAVENOUS | Status: DC
  Administered 2014-08-08: 500 mL via INTRAVENOUS

## 2014-08-07 MED ORDER — OXYCODONE-ACETAMINOPHEN 5-325 MG PO TABS: 1.0000 | ORAL_TABLET | ORAL | Status: DC | PRN

## 2014-08-07 MED ORDER — OXYTOCIN 40 UNITS IN LACTATED RINGERS INFUSION - SIMPLE MED
1.0000 m[IU]/min | INTRAVENOUS | Status: DC
  Administered 2014-08-07: 2 m[IU]/min via INTRAVENOUS
  Filled 2014-08-07: qty 1000

## 2014-08-07 MED ORDER — ACETAMINOPHEN 325 MG PO TABS: 650.0000 mg | ORAL_TABLET | ORAL | Status: DC | PRN

## 2014-08-07 MED ORDER — EPHEDRINE 5 MG/ML INJ
10.0000 mg | INTRAVENOUS | Status: DC | PRN
  Filled 2014-08-07: qty 2

## 2014-08-07 MED ORDER — FENTANYL 2.5 MCG/ML BUPIVACAINE 1/10 % EPIDURAL INFUSION (WH - ANES)
14.0000 mL/h | INTRAMUSCULAR | Status: DC | PRN
  Administered 2014-08-07: 14 mL/h via EPIDURAL
  Filled 2014-08-07: qty 125

## 2014-08-07 MED ORDER — OXYTOCIN 40 UNITS IN LACTATED RINGERS INFUSION - SIMPLE MED: 62.5000 mL/h | INTRAVENOUS | Status: DC

## 2014-08-07 MED ORDER — CITRIC ACID-SODIUM CITRATE 334-500 MG/5ML PO SOLN: 30.0000 mL | ORAL | Status: DC | PRN

## 2014-08-07 MED ORDER — OXYCODONE-ACETAMINOPHEN 5-325 MG PO TABS: 2.0000 | ORAL_TABLET | ORAL | Status: DC | PRN

## 2014-08-07 MED ORDER — LIDOCAINE HCL (PF) 1 % IJ SOLN
30.0000 mL | INTRAMUSCULAR | Status: DC | PRN
  Filled 2014-08-07: qty 30

## 2014-08-07 MED ORDER — ONDANSETRON HCL 4 MG/2ML IJ SOLN
4.0000 mg | Freq: Four times a day (QID) | INTRAMUSCULAR | Status: DC | PRN
  Administered 2014-08-07: 4 mg via INTRAVENOUS
  Filled 2014-08-07: qty 2

## 2014-08-07 MED ORDER — LACTATED RINGERS IV SOLN: 500.0000 mL | INTRAVENOUS | Status: DC | PRN

## 2014-08-07 MED ORDER — FENTANYL CITRATE 0.05 MG/ML IJ SOLN
100.0000 ug | INTRAMUSCULAR | Status: DC | PRN
  Administered 2014-08-07 (×4): 100 ug via INTRAVENOUS
  Filled 2014-08-07 (×4): qty 2

## 2014-08-07 MED ORDER — PHENYLEPHRINE 40 MCG/ML (10ML) SYRINGE FOR IV PUSH (FOR BLOOD PRESSURE SUPPORT)
80.0000 ug | PREFILLED_SYRINGE | INTRAVENOUS | Status: DC | PRN
  Filled 2014-08-07: qty 2
  Filled 2014-08-07: qty 10

## 2014-08-07 NOTE — Progress Notes (Signed)
[employer] # [id number] Pt has been contractions since 1930 yesterday. Pt describes pt contractions as every 5 minutes.  Pt states that she was in MAU and her cervix was not open

## 2014-08-07 NOTE — Patient Instructions (Signed)
Breastfeeding Deciding to breastfeed is one of the best choices you can make for you and your baby. A change in hormones during pregnancy causes your breast tissue to grow and increases the number and size of your milk ducts. These hormones also allow proteins, sugars, and fats from your blood supply to make breast milk in your milk-producing glands. Hormones prevent breast milk from being released before your baby is born as well as prompt milk flow after birth. Once breastfeeding has begun, thoughts of your baby, as well as his or her sucking or crying, can stimulate the release of milk from your milk-producing glands.  BENEFITS OF BREASTFEEDING For Your Baby  Your first milk (colostrum) helps your baby's digestive system function better.   There are antibodies in your milk that help your baby fight off infections.   Your baby has a lower incidence of asthma, allergies, and sudden infant death syndrome.   The nutrients in breast milk are better for your baby than infant formulas and are designed uniquely for your baby's needs.   Breast milk improves your baby's brain development.   Your baby is less likely to develop other conditions, such as childhood obesity, asthma, or type 2 diabetes mellitus.  For You   Breastfeeding helps to create a very special bond between you and your baby.   Breastfeeding is convenient. Breast milk is always available at the correct temperature and costs nothing.   Breastfeeding helps to burn calories and helps you lose the weight gained during pregnancy.   Breastfeeding makes your uterus contract to its prepregnancy size faster and slows bleeding (lochia) after you give birth.   Breastfeeding helps to lower your risk of developing type 2 diabetes mellitus, osteoporosis, and breast or ovarian cancer later in life. SIGNS THAT YOUR BABY IS HUNGRY Early Signs of Hunger  Increased alertness or activity.  Stretching.  Movement of the head from  side to side.  Movement of the head and opening of the mouth when the corner of the mouth or cheek is stroked (rooting).  Increased sucking sounds, smacking lips, cooing, sighing, or squeaking.  Hand-to-mouth movements.  Increased sucking of fingers or hands. Late Signs of Hunger  Fussing.  Intermittent crying. Extreme Signs of Hunger Signs of extreme hunger will require calming and consoling before your baby will be able to breastfeed successfully. Do not wait for the following signs of extreme hunger to occur before you initiate breastfeeding:   Restlessness.  A loud, strong cry.   Screaming. BREASTFEEDING BASICS Breastfeeding Initiation  Find a comfortable place to sit or lie down, with your neck and back well supported.  Place a pillow or rolled up blanket under your baby to bring him or her to the level of your breast (if you are seated). Nursing pillows are specially designed to help support your arms and your baby while you breastfeed.  Make sure that your baby's abdomen is facing your abdomen.   Gently massage your breast. With your fingertips, massage from your chest wall toward your nipple in a circular motion. This encourages milk flow. You may need to continue this action during the feeding if your milk flows slowly.  Support your breast with 4 fingers underneath and your thumb above your nipple. Make sure your fingers are well away from your nipple and your baby's mouth.   Stroke your baby's lips gently with your finger or nipple.   When your baby's mouth is open wide enough, quickly bring your baby to your   breast, placing your entire nipple and as much of the colored area around your nipple (areola) as possible into your baby's mouth.   More areola should be visible above your baby's upper lip than below the lower lip.   Your baby's tongue should be between his or her lower gum and your breast.   Ensure that your baby's mouth is correctly positioned  around your nipple (latched). Your baby's lips should create a seal on your breast and be turned out (everted).  It is common for your baby to suck about 2-3 minutes in order to start the flow of breast milk. Latching Teaching your baby how to latch on to your breast properly is very important. An improper latch can cause nipple pain and decreased milk supply for you and poor weight gain in your baby. Also, if your baby is not latched onto your nipple properly, he or she may swallow some air during feeding. This can make your baby fussy. Burping your baby when you switch breasts during the feeding can help to get rid of the air. However, teaching your baby to latch on properly is still the best way to prevent fussiness from swallowing air while breastfeeding. Signs that your baby has successfully latched on to your nipple:    Silent tugging or silent sucking, without causing you pain.   Swallowing heard between every 3-4 sucks.    Muscle movement above and in front of his or her ears while sucking.  Signs that your baby has not successfully latched on to nipple:   Sucking sounds or smacking sounds from your baby while breastfeeding.  Nipple pain. If you think your baby has not latched on correctly, slip your finger into the corner of your baby's mouth to break the suction and place it between your baby's gums. Attempt breastfeeding initiation again. Signs of Successful Breastfeeding Signs from your baby:   A gradual decrease in the number of sucks or complete cessation of sucking.   Falling asleep.   Relaxation of his or her body.   Retention of a small amount of milk in his or her mouth.   Letting go of your breast by himself or herself. Signs from you:  Breasts that have increased in firmness, weight, and size 1-3 hours after feeding.   Breasts that are softer immediately after breastfeeding.  Increased milk volume, as well as a change in milk consistency and color by  the fifth day of breastfeeding.   Nipples that are not sore, cracked, or bleeding. Signs That Your Baby is Getting Enough Milk  Wetting at least 3 diapers in a 24-hour period. The urine should be clear and pale yellow by age 5 days.  At least 3 stools in a 24-hour period by age 5 days. The stool should be soft and yellow.  At least 3 stools in a 24-hour period by age 7 days. The stool should be seedy and yellow.  No loss of weight greater than 10% of birth weight during the first 3 days of age.  Average weight gain of 4-7 ounces (113-198 g) per week after age 4 days.  Consistent daily weight gain by age 5 days, without weight loss after the age of 2 weeks. After a feeding, your baby may spit up a small amount. This is common. BREASTFEEDING FREQUENCY AND DURATION Frequent feeding will help you make more milk and can prevent sore nipples and breast engorgement. Breastfeed when you feel the need to reduce the fullness of your breasts   or when your baby shows signs of hunger. This is called "breastfeeding on demand." Avoid introducing a pacifier to your baby while you are working to establish breastfeeding (the first 4-6 weeks after your baby is born). After this time you may choose to use a pacifier. Research has shown that pacifier use during the first year of a baby's life decreases the risk of sudden infant death syndrome (SIDS). Allow your baby to feed on each breast as long as he or she wants. Breastfeed until your baby is finished feeding. When your baby unlatches or falls asleep while feeding from the first breast, offer the second breast. Because newborns are often sleepy in the first few weeks of life, you may need to awaken your baby to get him or her to feed. Breastfeeding times will vary from baby to baby. However, the following rules can serve as a guide to help you ensure that your baby is properly fed:  Newborns (babies 4 weeks of age or younger) may breastfeed every 1-3  hours.  Newborns should not go longer than 3 hours during the day or 5 hours during the night without breastfeeding.  You should breastfeed your baby a minimum of 8 times in a 24-hour period until you begin to introduce solid foods to your baby at around 6 months of age. BREAST MILK PUMPING Pumping and storing breast milk allows you to ensure that your baby is exclusively fed your breast milk, even at times when you are unable to breastfeed. This is especially important if you are going back to work while you are still breastfeeding or when you are not able to be present during feedings. Your lactation consultant can give you guidelines on how long it is safe to store breast milk.  A breast pump is a machine that allows you to pump milk from your breast into a sterile bottle. The pumped breast milk can then be stored in a refrigerator or freezer. Some breast pumps are operated by hand, while others use electricity. Ask your lactation consultant which type will work best for you. Breast pumps can be purchased, but some hospitals and breastfeeding support groups lease breast pumps on a monthly basis. A lactation consultant can teach you how to hand express breast milk, if you prefer not to use a pump.  CARING FOR YOUR BREASTS WHILE YOU BREASTFEED Nipples can become dry, cracked, and sore while breastfeeding. The following recommendations can help keep your breasts moisturized and healthy:  Avoid using soap on your nipples.   Wear a supportive bra. Although not required, special nursing bras and tank tops are designed to allow access to your breasts for breastfeeding without taking off your entire bra or top. Avoid wearing underwire-style bras or extremely tight bras.  Air dry your nipples for 3-4minutes after each feeding.   Use only cotton bra pads to absorb leaked breast milk. Leaking of breast milk between feedings is normal.   Use lanolin on your nipples after breastfeeding. Lanolin helps to  maintain your skin's normal moisture barrier. If you use pure lanolin, you do not need to wash it off before feeding your baby again. Pure lanolin is not toxic to your baby. You may also hand express a few drops of breast milk and gently massage that milk into your nipples and allow the milk to air dry. In the first few weeks after giving birth, some women experience extremely full breasts (engorgement). Engorgement can make your breasts feel heavy, warm, and tender to the   touch. Engorgement peaks within 3-5 days after you give birth. The following recommendations can help ease engorgement:  Completely empty your breasts while breastfeeding or pumping. You may want to start by applying warm, moist heat (in the shower or with warm water-soaked hand towels) just before feeding or pumping. This increases circulation and helps the milk flow. If your baby does not completely empty your breasts while breastfeeding, pump any extra milk after he or she is finished.  Wear a snug bra (nursing or regular) or tank top for 1-2 days to signal your body to slightly decrease milk production.  Apply ice packs to your breasts, unless this is too uncomfortable for you.  Make sure that your baby is latched on and positioned properly while breastfeeding. If engorgement persists after 48 hours of following these recommendations, contact your health care provider or a lactation consultant. OVERALL HEALTH CARE RECOMMENDATIONS WHILE BREASTFEEDING  Eat healthy foods. Alternate between meals and snacks, eating 3 of each per day. Because what you eat affects your breast milk, some of the foods may make your baby more irritable than usual. Avoid eating these foods if you are sure that they are negatively affecting your baby.  Drink milk, fruit juice, and water to satisfy your thirst (about 10 glasses a day).   Rest often, relax, and continue to take your prenatal vitamins to prevent fatigue, stress, and anemia.  Continue  breast self-awareness checks.  Avoid chewing and smoking tobacco.  Avoid alcohol and drug use. Some medicines that may be harmful to your baby can pass through breast milk. It is important to ask your health care provider before taking any medicine, including all over-the-counter and prescription medicine as well as vitamin and herbal supplements. It is possible to become pregnant while breastfeeding. If birth control is desired, ask your health care provider about options that will be safe for your baby. SEEK MEDICAL CARE IF:   You feel like you want to stop breastfeeding or have become frustrated with breastfeeding.  You have painful breasts or nipples.  Your nipples are cracked or bleeding.  Your breasts are red, tender, or warm.  You have a swollen area on either breast.  You have a fever or chills.  You have nausea or vomiting.  You have drainage other than breast milk from your nipples.  Your breasts do not become full before feedings by the fifth day after you give birth.  You feel sad and depressed.  Your baby is too sleepy to eat well.  Your baby is having trouble sleeping.   Your baby is wetting less than 3 diapers in a 24-hour period.  Your baby has less than 3 stools in a 24-hour period.  Your baby's skin or the white part of his or her eyes becomes yellow.   Your baby is not gaining weight by 5 days of age. SEEK IMMEDIATE MEDICAL CARE IF:   Your baby is overly tired (lethargic) and does not want to wake up and feed.  Your baby develops an unexplained fever. Document Released: 08/02/2005 Document Revised: 08/07/2013 Document Reviewed: 01/24/2013 ExitCare Patient Information 2015 [employer]. This information is not intended to replace advice given to you by your health care provider. Make sure you discuss any questions you have with your health care provider.  

## 2014-08-07 NOTE — Progress Notes (Signed)
39 weeks, contracting every 5 minutes per report.  Seen in MAU last night and sent home.  Reports contractions are more severe.  Having dark bloody show x 2 days.   Cervical check: 3-4 cm Dark blood seen on glove. Pt to MAU now with nurse.    *Languange line used.

## 2014-08-07 NOTE — Anesthesia Procedure Notes (Signed)
Epidural Patient location during procedure: OB Start time: 08/07/2014 10:14 PM  Staffing Anesthesiologist: Brayton CavesJACKSON, Andri Prestia Performed by: anesthesiologist   Preanesthetic Checklist Completed: patient identified, site marked, surgical consent, pre-op evaluation, timeout performed, IV checked, risks and benefits discussed and monitors and equipment checked  Epidural Patient position: sitting Prep: site prepped and draped and DuraPrep Patient monitoring: continuous pulse ox and blood pressure Approach: midline Location: L2-L3 Injection technique: LOR air  Needle:  Needle type: Tuohy  Needle gauge: 17 G Needle length: 9 cm and 9 Needle insertion depth: 5 cm cm Catheter type: closed end flexible Catheter size: 19 Gauge Catheter at skin depth: 10 cm Test dose: negative  Assessment Events: blood not aspirated, injection not painful, no injection resistance, negative IV test and no paresthesia  Additional Notes Patient identified.  Risk benefits discussed including failed block, incomplete pain control, headache, nerve damage, paralysis, blood pressure changes, nausea, vomiting, reactions to medication both toxic or allergic, and postpartum back pain.  Patient expressed understanding and wished to proceed.  All questions were answered.  Sterile technique used throughout procedure and epidural site dressed with sterile barrier dressing. No paresthesia or other complications noted.The patient did not experience any signs of intravascular injection such as tinnitus or metallic taste in mouth nor signs of intrathecal spread such as rapid motor block. Please see nursing notes for vital signs.

## 2014-08-07 NOTE — Progress Notes (Signed)
Patient ID: West CarboLetensa Fisseha Koors, female   DOB: 10/06/1985, 28 y.o.   MRN: [id number] Breathing through contractions Filed Vitals:   08/07/14 1917 08/07/14 1950 08/07/14 2002 08/07/14 2030  BP: 131/84 129/86 125/89 140/80  Pulse: 81 79 83 75  Temp: 97.9 F (36.6 C) 98 F (36.7 C)    TempSrc: Axillary Oral    Resp: 18 18 18 18   Height:      Weight:       FHR reassuring UCs every 2-3 min  Dilation: 5.5 Effacement (%): 100 Cervical Position: Middle Station: -2 Presentation: Vertex Exam by:: Dr Loreta AveAcosta  Wants epidural

## 2014-08-07 NOTE — H&P (Signed)
LABOR ADMISSION HISTORY AND PHYSICAL  Dawn Banks is a 28 y.o. female [phone] with IUP at 58w3dby LMP c/w 153w5dresenting in labor having made some cervical change in MAU. She desires an epidural for labor pain control. She plans on breast and bottle feeding. She declines birth control.   Dating: By LMP cw 1872w5dno --->  Estimated Date of Delivery: 08/11/14   Prenatal History/Complications:  Past Medical History: History reviewed. No pertinent past medical history.  Past Surgical History: Past Surgical History  Procedure Laterality Date  . Cesarean section      Obstetrical History: OB History    Gravida Para Term Preterm AB TAB SAB Ectopic Multiple Living   4 3 3  0 0 0 0 0 0 3     Social History: History   Social History  . Marital Status: Married    Spouse Name: N/A    Number of Children: N/A  . Years of Education: N/A   Social History Main Topics  . Smoking status: Never Smoker   . Smokeless tobacco: Never Used  . Alcohol Use: No  . Drug Use: No  . Sexual Activity: Yes   Other Topics Concern  . None   Social History Narrative    Family History: History reviewed. No pertinent family history.  Allergies: Allergies  Allergen Reactions  . Prenatal Vitamins Nausea And Vomiting    Vomiting blood    Prescriptions prior to admission  Medication Sig Dispense Refill Last Dose  . ACCU-CHEK FASTCLIX LANCETS MISC   1 Taking  . ACCU-CHEK SMARTVIEW test strip   1 Taking  . Blood Glucose Monitoring Suppl (ACCU-CHEK NANO SMARTVIEW) W/DEVICE KIT 1 each by Does not apply route 4 (four) times daily.   Taking     Review of Systems   All systems reviewed and negative except as stated in HPI  Blood pressure 128/69, pulse 84, temperature 98.1 F (36.7 C), temperature source Oral, resp. rate 18, last menstrual period 11/04/2013. General appearance: alert and cooperative Lungs: clear to auscultation bilaterally Heart: regular rate and rhythm Abdomen:  soft, non-tender; bowel sounds normal Pelvic: adequate Extremities: Homans sign is negative, no sign of DVT Presentation: cephalic Fetal monitoringBaseline: 140 bpm, Variability: Fair (1-6 bpm), Accelerations: nonreactive and Decelerations: Absent Uterine activityq5-6mi6mDilation: 4 Effacement (%): 70 Station: -3 Exam by:: cheryl motte, rn   Prenatal labs: ABO, Rh: O/POS/-- (06/30 09174166tibody: NEG (06/30 0917) Rubella:   RPR: NON REAC (10/07 1057)  HBsAg: NEGATIVE (06/30 0917)  HIV: NONREACTIVE (10/07 1057)  GBS: Negative (11/30 0000)  1 hr Glucola 163 => 3h 81/188/163/150 Genetic screening  Normal QUAD Anatomy US nKoreamal (small amount of pericardial fluid initially noted, subsequently resolved)  For Breech, LTCS by Dr LeggGala Romney1   >>>  Desires TOLAC - VBAC consent signed 07/22/14 - scanned under media tab Previously had 2 SVDs, Pelvis proven to 9+12  Clinic  Low Risk  Dating LMP c/w 18 week US  Koreanetic Screen Quad:    All negative        NIPS:  Anatomic US  Korearmal, but small amt of pericardial fluid seen, resolved as of 05/27/14  GTT Early:          Third trimester: 163. Abnormal 3 hour>>GDM  TDaP vaccine 05/22/14  Flu vaccine 09/15  GBS neg  Contraception   Baby Food  Breast  Circumcision girl  Pediatrician undecided  Support Person       Results for orders placed or performed  during the hospital encounter of 08/07/14 (from the past 24 hour(s))  POCT urinalysis dip (device)   Collection Time: 08/07/14  8:18 AM  Result Value Ref Range   Glucose, UA NEGATIVE NEGATIVE mg/dL   Bilirubin Urine NEGATIVE NEGATIVE   Ketones, ur NEGATIVE NEGATIVE mg/dL   Specific Gravity, Urine <=1.005 1.005 - 1.030   Hgb urine dipstick MODERATE (A) NEGATIVE   pH 6.5 5.0 - 8.0   Protein, ur NEGATIVE NEGATIVE mg/dL   Urobilinogen, UA 0.2 0.0 - 1.0 mg/dL   Nitrite NEGATIVE NEGATIVE   Leukocytes, UA TRACE (A) NEGATIVE    Patient Active Problem List   Diagnosis Date Noted  .  Active labor at term 08/07/2014  . [redacted] weeks gestation of pregnancy   . Diet controlled gestational diabetes mellitus in third trimester   . History of cesarean delivery affecting pregnancy   . Gestational diabetes mellitus in third trimester 06/19/2014  . Abnormal maternal glucose tolerance, antepartum 06/06/2014  . History of cesarean delivery, antepartum 03/06/2014    Assessment: Dawn Banks is a 28 y.o. [phone] at 32w3dhere for trial of labor after previous cesarean section 2/2 breech presentation preceded by 2 vaginal deliveries.  #Labor: will AROM as pt is not truly in active labor, she was admitted after requesting admission as [facility] has been [facility] multiple times[facility] => AROM this check for augmentation, thick meconium noted #Pain: Epidural upon request #FWB: Cat I #ID:  GBS neg #MOF: breast #MOC: declines #Circ:  N/a GDM: A1DM, CBGs ordered   Trial of labor:  Risk of uterine rupture at term is 0.78 percent with TOLAC and 0.22 percent with ERCD. 1 in 10 uterine ruptures will result in neonatal death or neurological injury. The benefits of a trial of labor after cesarean (TOLAC) resulting in a vaginal birth after cesarean (VBAC) include the following: shorter length of hospital stay and postpartum recovery (in most cases); fewer complications, such as postpartum fever, wound or uterine infection, thromboembolism (blood clots in the leg or lung), need for blood transfusion and fewer neonatal breathing problems. The risks of an attempted VBAC or TOLAC include the following: Risk of failed trial of labor after cesarean (TOLAC) without a vaginal birth after cesarean (VBAC) resulting in repeat cesarean delivery (RCD) in about 20 to 42percent of women who attempt VBAC.  Risk of rupture of uterus resulting in an emergency cesarean delivery. The risk of uterine rupture may be related in part to the type of uterine incision made during the  first cesarean delivery. A previous transverse uterine incision has the lowest risk of rupture (0.2 to 1.5 percent risk). Vertical or T-shaped uterine incisions have a higher risk of uterine rupture (4 to 9 percent risk)The risk of fetal death is very low with both VBAC and elective repeat cesarean delivery (ERCD), but the likelihood of fetal death is higher with VBAC than with ERCD. Maternal death is very rare with either type of delivery. The risks of an elective repeat cesarean delivery (ERCD) were reviewed with the patient including but not limited to: 09/998 risk of uterine rupture which could have serious consequences, bleeding which may require transfusion; infection which may require antibiotics; injury to bowel, bladder or other surrounding organs (bowel, bladder, ureters); injury to the fetus; need for additional procedures including hysterectomy in the event of a life-threatening hemorrhage; thromboembolic phenomenon; abnormal placentation; incisional problems; death and other postoperative or anesthesia complications.    These risks and  benefits are summarized on the consent form, which was reviewed with the patient during the visit.  All her questions answered and she signed a consent indicating a preference for TOLAC/ERCD. A copy of the consent was given to the patient.  Dawn Banks Dawn Banks 08/07/2014, 12:35 PM

## 2014-08-07 NOTE — Anesthesia Preprocedure Evaluation (Signed)
Anesthesia Evaluation  Patient identified by MRN, date of birth, ID band Patient awake    Reviewed: Allergy & Precautions, H&P , Patient's Chart, lab work & pertinent test results  Airway Mallampati: II TM Distance: >3 FB Neck ROM: full    Dental   Pulmonary  breath sounds clear to auscultation        Cardiovascular Rhythm:regular Rate:Normal     Neuro/Psych    GI/Hepatic   Endo/Other  diabetes  Renal/GU      Musculoskeletal   Abdominal   Peds  Hematology   Anesthesia Other Findings   Reproductive/Obstetrics (+) Pregnancy                           Anesthesia Physical Anesthesia Plan  ASA: III  Anesthesia Plan: Epidural   Post-op Pain Management:    Induction:   Airway Management Planned:   Additional Equipment:   Intra-op Plan:   Post-operative Plan:   Informed Consent: I have reviewed the patients History and Physical, chart, labs and discussed the procedure including the risks, benefits and alternatives for the proposed anesthesia with the patient or authorized representative who has indicated his/her understanding and acceptance.     Plan Discussed with:   Anesthesia Plan Comments:         Anesthesia Quick Evaluation  

## 2014-08-07 NOTE — MAU Note (Signed)
[facility] and was 3-4 cm. Sent to MAU for labor evaluation. Patient state she is having frequent contractions.

## 2014-08-07 NOTE — Progress Notes (Signed)
Patient educated on medicinal pain management options via phone interpreter. Patient vocalizes understanding of pain control choices, and plan of care including augmentation of labor with Pitocin. No further questions or concerns at this time. Will continue to monitor.

## 2014-08-08 ENCOUNTER — Encounter (HOSPITAL_COMMUNITY): Payer: Self-pay | Admitting: *Deleted

## 2014-08-08 DIAGNOSIS — Z3A39 39 weeks gestation of pregnancy: Secondary | ICD-10-CM

## 2014-08-08 LAB — GLUCOSE, CAPILLARY: Glucose-Capillary: 46 mg/dL — ABNORMAL LOW (ref 70–99)

## 2014-08-08 MED ORDER — LANOLIN HYDROUS EX OINT: TOPICAL_OINTMENT | CUTANEOUS | Status: DC | PRN

## 2014-08-08 MED ORDER — DIPHENHYDRAMINE HCL 25 MG PO CAPS: 25.0000 mg | ORAL_CAPSULE | Freq: Four times a day (QID) | ORAL | Status: DC | PRN

## 2014-08-08 MED ORDER — OXYCODONE-ACETAMINOPHEN 5-325 MG PO TABS: 2.0000 | ORAL_TABLET | ORAL | Status: DC | PRN

## 2014-08-08 MED ORDER — DIBUCAINE 1 % RE OINT: 1.0000 "application " | TOPICAL_OINTMENT | RECTAL | Status: DC | PRN

## 2014-08-08 MED ORDER — ZOLPIDEM TARTRATE 5 MG PO TABS: 5.0000 mg | ORAL_TABLET | Freq: Every evening | ORAL | Status: DC | PRN

## 2014-08-08 MED ORDER — IBUPROFEN 600 MG PO TABS
600.0000 mg | ORAL_TABLET | Freq: Four times a day (QID) | ORAL | Status: DC
  Administered 2014-08-08 – 2014-08-09 (×6): 600 mg via ORAL
  Filled 2014-08-08 (×6): qty 1

## 2014-08-08 MED ORDER — BENZOCAINE-MENTHOL 20-0.5 % EX AERO: 1.0000 "application " | INHALATION_SPRAY | CUTANEOUS | Status: DC | PRN

## 2014-08-08 MED ORDER — TETANUS-DIPHTH-ACELL PERTUSSIS 5-2.5-18.5 LF-MCG/0.5 IM SUSP: 0.5000 mL | Freq: Once | INTRAMUSCULAR | Status: DC

## 2014-08-08 MED ORDER — ONDANSETRON HCL 4 MG PO TABS: 4.0000 mg | ORAL_TABLET | ORAL | Status: DC | PRN

## 2014-08-08 MED ORDER — OXYCODONE-ACETAMINOPHEN 5-325 MG PO TABS: 1.0000 | ORAL_TABLET | ORAL | Status: DC | PRN

## 2014-08-08 MED ORDER — SIMETHICONE 80 MG PO CHEW: 80.0000 mg | CHEWABLE_TABLET | ORAL | Status: DC | PRN

## 2014-08-08 MED ORDER — SENNOSIDES-DOCUSATE SODIUM 8.6-50 MG PO TABS
2.0000 | ORAL_TABLET | ORAL | Status: DC
  Administered 2014-08-09: 2 via ORAL
  Filled 2014-08-08: qty 2

## 2014-08-08 MED ORDER — WITCH HAZEL-GLYCERIN EX PADS: 1.0000 "application " | MEDICATED_PAD | CUTANEOUS | Status: DC | PRN

## 2014-08-08 MED ORDER — ONDANSETRON HCL 4 MG/2ML IJ SOLN: 4.0000 mg | INTRAMUSCULAR | Status: DC | PRN

## 2014-08-08 NOTE — Progress Notes (Signed)
Patient denies the need for an interpreter at this time. Patient made aware that the language line is available at any time she requests it.

## 2014-08-08 NOTE — Lactation Note (Signed)
This note was copied from the chart of Dawn Banks. Lactation Consultation Note  Initial visit done.  Breastfeeding consultation and support information given to patient.  Mom is experienced breastfeeding her previous babies.  Baby is currently latched well and breastfeeding actively.  Instructed on feeding cues and to feed baby with any cue.  Mom denies questions.  Encouraged to call with concerns/latch assist prn.  Patient Name: Dawn Weber CooksLetensa Lampi OVFIE'PToday's Date: 08/08/2014 Reason for consult: Initial assessment   Maternal Data    Feeding Feeding Type: Breast Fed Length of feed: 15 min  LATCH Score/Interventions Latch: Grasps breast easily, tongue down, lips flanged, rhythmical sucking. Intervention(s): Assist with latch;Adjust position  Audible Swallowing: A few with stimulation Intervention(s): Skin to skin;Hand expression  Type of Nipple: Everted at rest and after stimulation  Comfort (Breast/Nipple): Soft / non-tender     Hold (Positioning): Assistance needed to correctly position infant at breast and maintain latch. Intervention(s): Position options;Skin to skin;Breastfeeding basics reviewed  LATCH Score: 8  Lactation Tools Discussed/Used     Consult Status Consult Status: PRN    Huston FoleyMOULDEN, Elisabel Hanover S 08/08/2014, 4:28 PM

## 2014-08-08 NOTE — Progress Notes (Signed)
CBG 46. Apple juice with sugar packets given. Pt asymptomatic.

## 2014-08-08 NOTE — Anesthesia Postprocedure Evaluation (Signed)
  Anesthesia Post-op Note  Patient: Dawn Banks  Procedure(s) Performed: * No procedures listed *  Patient Location: Mother/Baby  Anesthesia Type:Epidural  Level of Consciousness: awake, alert , oriented and patient cooperative  Airway and Oxygen Therapy: Patient Spontanous Breathing  Post-op Pain: mild  Post-op Assessment: Post-op Vital signs reviewed, Patient's Cardiovascular Status Stable, Respiratory Function Stable, Patent Airway, No headache, No backache, No residual numbness and No residual motor weakness  Post-op Vital Signs: Reviewed and stable  Last Vitals:  Filed Vitals:   08/08/14 0629  BP: 114/70  Pulse: 84  Temp: 36.8 C  Resp: 18    Complications: No apparent anesthesia complications

## 2014-08-08 NOTE — Progress Notes (Signed)
UR chart review completed.  

## 2014-08-09 NOTE — Lactation Note (Addendum)
This note was copied from the chart of Girl Weber CooksLetensa Langhorne. Lactation Consultation Note  Patient Name: Girl Weber CooksLetensa Robotham ZOXWR'UToday's Date: 08/09/2014 Reason for consult: Follow-up assessment Baby 33 hours of life. Mom declines interpreter. Parents packing up cart to leave. Mom reports baby just finished nursing. Baby sleeping in car seat. Mom states that she is hearing swallows as baby nurses. Mom is experienced BF and reports baby nursing well. Enc parents to watch weight closely, and nurse with cues. Baby has had 4 voids and 2 stools in last 24 hours. Mom aware of OP/BFSG and LC phone line assistance after D/C.   Maternal Data    Feeding Feeding Type: Breast Fed Length of feed: 15 min  LATCH Score/Interventions                      Lactation Tools Discussed/Used     Consult Status Consult Status: Complete    Harlo Fabela 08/09/2014, 12:15 PM

## 2014-08-09 NOTE — Discharge Instructions (Signed)

## 2014-08-11 ENCOUNTER — Inpatient Hospital Stay (HOSPITAL_COMMUNITY): Admission: RE | Admit: 2014-08-11 | Payer: Medicaid Other | Source: Ambulatory Visit

## 2014-08-12 NOTE — Discharge Summary (Signed)
Obstetric Discharge Summary Reason for Admission: onset of labor Prenatal Procedures: NST Intrapartum Procedures: VBAC Postpartum Procedures: none Complications-Operative and Postpartum: none  Delivery Note At 3:03 AM a viable and healthy female was delivered via VBAC, Spontaneous (Presentation:LOA ;  ).  APGAR: , ; weight  .   Placenta status: Intact, Spontaneous.  Cord: 3 vessels with the following complications: Tight nuchal x 1.  THick meconium staining.   Upon delivery of head, attempted to deliver anterior shoulder. McRoberts employed with no delivery. Suprapubic pressure applied and anterior shoulder delivered. Baby had a lot of fluid and meconium, so was handed to RN team for suctioning.   Anesthesia:  Epidural Episiotomy:  none Lacerations:  Abrasions, perineal Suture Repair: none Est. Blood Loss (mL):    Mom to postpartum.  Baby to Couplet care / Skin to Skin.   Hospital Course:  Active Problems:   Active labor at term   Dawn Banks is a 28 y.o. [id number] s/p VBAC.  Patient presented to [facility] in active labor and was admitted to L&D.  She has postpartum course that was uncomplicated including no problems with ambulating, PO intake, urination, pain, or bleeding. The pt feels ready to go home and  will be discharged with outpatient follow-up.   Today: No acute events overnight.  Pt denies problems with ambulating, voiding or po intake.  She denies nausea or vomiting.  Pain is well controlled.  Plan for birth control is  undecided.  Method of Feeding: breast   H/H: Lab Results  Component Value Date/Time   HGB 7.9* 08/07/2014 02:00 PM   HCT 26.5* 08/07/2014 02:00 PM    Discharge Diagnoses: Term Pregnancy-delivered  Discharge Information: Date: 08/12/2014 Activity: pelvic rest Diet: routine  Medications: None Breast feeding:  Yes Condition: stable Instructions: refer to handout Discharge to: home   Discharge Instructions    Call MD for:  redness,  tenderness, or signs of infection (pain, swelling, redness, odor or green/yellow discharge around incision site)    Complete by:  As directed      Call MD for:  severe uncontrolled pain    Complete by:  As directed      Call MD for:  temperature >100.4    Complete by:  As directed      Diet - low sodium heart healthy    Complete by:  As directed             Medication List    STOP taking these medications        ACCU-CHEK FASTCLIX LANCETS Misc     ACCU-CHEK NANO SMARTVIEW W/DEVICE Kit     ACCU-CHEK SMARTVIEW test strip  Generic drug:  glucose blood           Follow-up Information    Follow up with [facility] In 6 weeks.   Contact information:   [place] [id number]       Merla Riches ,MD OB Fellow 08/12/2014,12:23 PM

## 2014-09-18 ENCOUNTER — Ambulatory Visit: Payer: Medicaid Other | Admitting: Obstetrics & Gynecology

## 2014-09-20 ENCOUNTER — Ambulatory Visit (INDEPENDENT_AMBULATORY_CARE_PROVIDER_SITE_OTHER): Payer: Medicaid Other | Admitting: Obstetrics & Gynecology

## 2014-09-20 ENCOUNTER — Encounter: Payer: Self-pay | Admitting: Obstetrics & Gynecology

## 2014-09-20 DIAGNOSIS — Z8632 Personal history of gestational diabetes: Secondary | ICD-10-CM

## 2014-09-20 NOTE — Patient Instructions (Signed)
Preventive Care for Adults A healthy lifestyle and preventive care can promote health and wellness. Preventive health guidelines for women include the following key practices.  A routine yearly physical is a good way to check with your health care provider about your health and preventive screening. It is a chance to share any concerns and updates on your health and to receive a thorough exam.  Visit your dentist for a routine exam and preventive care every 6 months. Brush your teeth twice a day and floss once a day. Good oral hygiene prevents tooth decay and gum disease.  The frequency of eye exams is based on your age, health, family medical history, use of contact lenses, and other factors. Follow your health care provider's recommendations for frequency of eye exams.  Eat a healthy diet. Foods like vegetables, fruits, whole grains, low-fat dairy products, and lean protein foods contain the nutrients you need without too many calories. Decrease your intake of foods high in solid fats, added sugars, and salt. Eat the right amount of calories for you.Get information about a proper diet from your health care provider, if necessary.  Regular physical exercise is one of the most important things you can do for your health. Most adults should get at least 150 minutes of moderate-intensity exercise (any activity that increases your heart rate and causes you to sweat) each week. In addition, most adults need muscle-strengthening exercises on 2 or more days a week.  Maintain a healthy weight. The body mass index (BMI) is a screening tool to identify possible weight problems. It provides an estimate of body fat based on height and weight. Your health care provider can find your BMI and can help you achieve or maintain a healthy weight.For adults 20 years and older:  A BMI below 18.5 is considered underweight.  A BMI of 18.5 to 24.9 is normal.  A BMI of 25 to 29.9 is considered overweight.  A BMI of  30 and above is considered obese.  Maintain normal blood lipids and cholesterol levels by exercising and minimizing your intake of saturated fat. Eat a balanced diet with plenty of fruit and vegetables. Blood tests for lipids and cholesterol should begin at age 20 and be repeated every 5 years. If your lipid or cholesterol levels are high, you are over 50, or you are at high risk for heart disease, you may need your cholesterol levels checked more frequently.Ongoing high lipid and cholesterol levels should be treated with medicines if diet and exercise are not working.  If you smoke, find out from your health care provider how to quit. If you do not use tobacco, do not start.  Lung cancer screening is recommended for adults aged 55-80 years who are at high risk for developing lung cancer because of a history of smoking. A yearly low-dose CT scan of the lungs is recommended for people who have at least a 30-pack-year history of smoking and are a current smoker or have quit within the past 15 years. A pack year of smoking is smoking an average of 1 pack of cigarettes a day for 1 year (for example: 1 pack a day for 30 years or 2 packs a day for 15 years). Yearly screening should continue until the smoker has stopped smoking for at least 15 years. Yearly screening should be stopped for people who develop a health problem that would prevent them from having lung cancer treatment.  If you are pregnant, do not drink alcohol. If you are breastfeeding,   be very cautious about drinking alcohol. If you are not pregnant and choose to drink alcohol, do not have more than 1 drink per day. One drink is considered to be 12 ounces (355 mL) of beer, 5 ounces (148 mL) of wine, or 1.5 ounces (44 mL) of liquor.  Avoid use of street drugs. Do not share needles with anyone. Ask for help if you need support or instructions about stopping the use of drugs.  High blood pressure causes heart disease and increases the risk of  stroke. Your blood pressure should be checked at least every 1 to 2 years. Ongoing high blood pressure should be treated with medicines if weight loss and exercise do not work.  If you are 75-52 years old, ask your health care provider if you should take aspirin to prevent strokes.  Diabetes screening involves taking a blood sample to check your fasting blood sugar level. This should be done once every 3 years, after age 15, if you are within normal weight and without risk factors for diabetes. Testing should be considered at a younger age or be carried out more frequently if you are overweight and have at least 1 risk factor for diabetes.  Breast cancer screening is essential preventive care for women. You should practice "breast self-awareness." This means understanding the normal appearance and feel of your breasts and may include breast self-examination. Any changes detected, no matter how small, should be reported to a health care provider. Women in their 58s and 30s should have a clinical breast exam (CBE) by a health care provider as part of a regular health exam every 1 to 3 years. After age 16, women should have a CBE every year. Starting at age 53, women should consider having a mammogram (breast X-ray test) every year. Women who have a family history of breast cancer should talk to their health care provider about genetic screening. Women at a high risk of breast cancer should talk to their health care providers about having an MRI and a mammogram every year.  Breast cancer gene (BRCA)-related cancer risk assessment is recommended for women who have family members with BRCA-related cancers. BRCA-related cancers include breast, ovarian, tubal, and peritoneal cancers. Having family members with these cancers may be associated with an increased risk for harmful changes (mutations) in the breast cancer genes BRCA1 and BRCA2. Results of the assessment will determine the need for genetic counseling and  BRCA1 and BRCA2 testing.  Routine pelvic exams to screen for cancer are no longer recommended for nonpregnant women who are considered low risk for cancer of the pelvic organs (ovaries, uterus, and vagina) and who do not have symptoms. Ask your health care provider if a screening pelvic exam is right for you.  If you have had past treatment for cervical cancer or a condition that could lead to cancer, you need Pap tests and screening for cancer for at least 20 years after your treatment. If Pap tests have been discontinued, your risk factors (such as having a new sexual partner) need to be reassessed to determine if screening should be resumed. Some women have medical problems that increase the chance of getting cervical cancer. In these cases, your health care provider may recommend more frequent screening and Pap tests.  The HPV test is an additional test that may be used for cervical cancer screening. The HPV test looks for the virus that can cause the cell changes on the cervix. The cells collected during the Pap test can be  tested for HPV. The HPV test could be used to screen women aged 30 years and older, and should be used in women of any age who have unclear Pap test results. After the age of 30, women should have HPV testing at the same frequency as a Pap test.  Colorectal cancer can be detected and often prevented. Most routine colorectal cancer screening begins at the age of 50 years and continues through age 75 years. However, your health care provider may recommend screening at an earlier age if you have risk factors for colon cancer. On a yearly basis, your health care provider may provide home test kits to check for hidden blood in the stool. Use of a small camera at the end of a tube, to directly examine the colon (sigmoidoscopy or colonoscopy), can detect the earliest forms of colorectal cancer. Talk to your health care provider about this at age 50, when routine screening begins. Direct  exam of the colon should be repeated every 5-10 years through age 75 years, unless early forms of pre-cancerous polyps or small growths are found.  People who are at an increased risk for hepatitis B should be screened for this virus. You are considered at high risk for hepatitis B if:  You were born in a country where hepatitis B occurs often. Talk with your health care provider about which countries are considered high risk.  Your parents were born in a high-risk country and you have not received a shot to protect against hepatitis B (hepatitis B vaccine).  You have HIV or AIDS.  You use needles to inject street drugs.  You live with, or have sex with, someone who has hepatitis B.  You get hemodialysis treatment.  You take certain medicines for conditions like cancer, organ transplantation, and autoimmune conditions.  Hepatitis C blood testing is recommended for all people born from 1945 through 1965 and any individual with known risks for hepatitis C.  Practice safe sex. Use condoms and avoid high-risk sexual practices to reduce the spread of sexually transmitted infections (STIs). STIs include gonorrhea, chlamydia, syphilis, trichomonas, herpes, HPV, and human immunodeficiency virus (HIV). Herpes, HIV, and HPV are viral illnesses that have no cure. They can result in disability, cancer, and death.  You should be screened for sexually transmitted illnesses (STIs) including gonorrhea and chlamydia if:  You are sexually active and are younger than 24 years.  You are older than 24 years and your health care provider tells you that you are at risk for this type of infection.  Your sexual activity has changed since you were last screened and you are at an increased risk for chlamydia or gonorrhea. Ask your health care provider if you are at risk.  If you are at risk of being infected with HIV, it is recommended that you take a prescription medicine daily to prevent HIV infection. This is  called preexposure prophylaxis (PrEP). You are considered at risk if:  You are a heterosexual woman, are sexually active, and are at increased risk for HIV infection.  You take drugs by injection.  You are sexually active with a partner who has HIV.  Talk with your health care provider about whether you are at high risk of being infected with HIV. If you choose to begin PrEP, you should first be tested for HIV. You should then be tested every 3 months for as long as you are taking PrEP.  Osteoporosis is a disease in which the bones lose minerals and strength   with aging. This can result in serious bone fractures or breaks. The risk of osteoporosis can be identified using a bone density scan. Women ages 65 years and over and women at risk for fractures or osteoporosis should discuss screening with their health care providers. Ask your health care provider whether you should take a calcium supplement or vitamin D to reduce the rate of osteoporosis.  Menopause can be associated with physical symptoms and risks. Hormone replacement therapy is available to decrease symptoms and risks. You should talk to your health care provider about whether hormone replacement therapy is right for you.  Use sunscreen. Apply sunscreen liberally and repeatedly throughout the day. You should seek shade when your shadow is shorter than you. Protect yourself by wearing long sleeves, pants, a wide-brimmed hat, and sunglasses year round, whenever you are outdoors.  Once a month, do a whole body skin exam, using a mirror to look at the skin on your back. Tell your health care provider of new moles, moles that have irregular borders, moles that are larger than a pencil eraser, or moles that have changed in shape or color.  Stay current with required vaccines (immunizations).  Influenza vaccine. All adults should be immunized every year.  Tetanus, diphtheria, and acellular pertussis (Td, Tdap) vaccine. Pregnant women should  receive 1 dose of Tdap vaccine during each pregnancy. The dose should be obtained regardless of the length of time since the last dose. Immunization is preferred during the 27th-36th week of gestation. An adult who has not previously received Tdap or who does not know her vaccine status should receive 1 dose of Tdap. This initial dose should be followed by tetanus and diphtheria toxoids (Td) booster doses every 10 years. Adults with an unknown or incomplete history of completing a 3-dose immunization series with Td-containing vaccines should begin or complete a primary immunization series including a Tdap dose. Adults should receive a Td booster every 10 years.  Varicella vaccine. An adult without evidence of immunity to varicella should receive 2 doses or a second dose if she has previously received 1 dose. Pregnant females who do not have evidence of immunity should receive the first dose after pregnancy. This first dose should be obtained before leaving the health care facility. The second dose should be obtained 4-8 weeks after the first dose.  Human papillomavirus (HPV) vaccine. Females aged 13-26 years who have not received the vaccine previously should obtain the 3-dose series. The vaccine is not recommended for use in pregnant females. However, pregnancy testing is not needed before receiving a dose. If a female is found to be pregnant after receiving a dose, no treatment is needed. In that case, the remaining doses should be delayed until after the pregnancy. Immunization is recommended for any person with an immunocompromised condition through the age of 26 years if she did not get any or all doses earlier. During the 3-dose series, the second dose should be obtained 4-8 weeks after the first dose. The third dose should be obtained 24 weeks after the first dose and 16 weeks after the second dose.  Zoster vaccine. One dose is recommended for adults aged 60 years or older unless certain conditions are  present.  Measles, mumps, and rubella (MMR) vaccine. Adults born before 1957 generally are considered immune to measles and mumps. Adults born in 1957 or later should have 1 or more doses of MMR vaccine unless there is a contraindication to the vaccine or there is laboratory evidence of immunity to   each of the three diseases. A routine second dose of MMR vaccine should be obtained at least 28 days after the first dose for students attending postsecondary schools, health care workers, or international travelers. People who received inactivated measles vaccine or an unknown type of measles vaccine during 1963-1967 should receive 2 doses of MMR vaccine. People who received inactivated mumps vaccine or an unknown type of mumps vaccine before 1979 and are at high risk for mumps infection should consider immunization with 2 doses of MMR vaccine. For females of childbearing age, rubella immunity should be determined. If there is no evidence of immunity, females who are not pregnant should be vaccinated. If there is no evidence of immunity, females who are pregnant should delay immunization until after pregnancy. Unvaccinated health care workers born before 1957 who lack laboratory evidence of measles, mumps, or rubella immunity or laboratory confirmation of disease should consider measles and mumps immunization with 2 doses of MMR vaccine or rubella immunization with 1 dose of MMR vaccine.  Pneumococcal 13-valent conjugate (PCV13) vaccine. When indicated, a person who is uncertain of her immunization history and has no record of immunization should receive the PCV13 vaccine. An adult aged 19 years or older who has certain medical conditions and has not been previously immunized should receive 1 dose of PCV13 vaccine. This PCV13 should be followed with a dose of pneumococcal polysaccharide (PPSV23) vaccine. The PPSV23 vaccine dose should be obtained at least 8 weeks after the dose of PCV13 vaccine. An adult aged 19  years or older who has certain medical conditions and previously received 1 or more doses of PPSV23 vaccine should receive 1 dose of PCV13. The PCV13 vaccine dose should be obtained 1 or more years after the last PPSV23 vaccine dose.  Pneumococcal polysaccharide (PPSV23) vaccine. When PCV13 is also indicated, PCV13 should be obtained first. All adults aged 65 years and older should be immunized. An adult younger than age 65 years who has certain medical conditions should be immunized. Any person who resides in a nursing home or long-term care facility should be immunized. An adult smoker should be immunized. People with an immunocompromised condition and certain other conditions should receive both PCV13 and PPSV23 vaccines. People with human immunodeficiency virus (HIV) infection should be immunized as soon as possible after diagnosis. Immunization during chemotherapy or radiation therapy should be avoided. Routine use of PPSV23 vaccine is not recommended for American Indians, [place] Natives, or people younger than 65 years unless there are medical conditions that require PPSV23 vaccine. When indicated, people who have unknown immunization and have no record of immunization should receive PPSV23 vaccine. One-time revaccination 5 years after the first dose of PPSV23 is recommended for people aged 19-64 years who have chronic kidney failure, nephrotic syndrome, asplenia, or immunocompromised conditions. People who received 1-2 doses of PPSV23 before age 65 years should receive another dose of PPSV23 vaccine at age 65 years or later if at least 5 years have passed since the previous dose. Doses of PPSV23 are not needed for people immunized with PPSV23 at or after age 65 years.  Meningococcal vaccine. Adults with asplenia or persistent complement component deficiencies should receive 2 doses of quadrivalent meningococcal conjugate (MenACWY-D) vaccine. The doses should be obtained at least 2 months apart.  Microbiologists working with certain meningococcal bacteria, military recruits, people at risk during an outbreak, and people who travel to or live in countries with a high rate of meningitis should be immunized. A first-year college student up through age   21 years who is living in a residence hall should receive a dose if she did not receive a dose on or after her 16th birthday. Adults who have certain high-risk conditions should receive one or more doses of vaccine.  Hepatitis A vaccine. Adults who wish to be protected from this disease, have certain high-risk conditions, work with hepatitis A-infected animals, work in hepatitis A research labs, or travel to or work in countries with a high rate of hepatitis A should be immunized. Adults who were previously unvaccinated and who anticipate close contact with an international adoptee during the first 60 days after arrival in the [place] from a country with a high rate of hepatitis A should be immunized.  Hepatitis B vaccine. Adults who wish to be protected from this disease, have certain high-risk conditions, may be exposed to blood or other infectious body fluids, are household contacts or sex partners of hepatitis B positive people, are clients or workers in certain care facilities, or travel to or work in countries with a high rate of hepatitis B should be immunized.  Haemophilus influenzae type b (Hib) vaccine. A previously unvaccinated person with asplenia or sickle cell disease or having a scheduled splenectomy should receive 1 dose of Hib vaccine. Regardless of previous immunization, a recipient of a hematopoietic stem cell transplant should receive a 3-dose series 6-12 months after her successful transplant. Hib vaccine is not recommended for adults with HIV infection. Preventive Services / Frequency Ages 64 to 68 years  Blood pressure check.** / Every 1 to 2 years.  Lipid and cholesterol check.** / Every 5 years beginning at age  22.  Clinical breast exam.** / Every 3 years for women in their 88s and 53s.  BRCA-related cancer risk assessment.** / For women who have family members with a BRCA-related cancer (breast, ovarian, tubal, or peritoneal cancers).  Pap test.** / Every 2 years from ages 90 through 51. Every 3 years starting at age 21 through age 56 or 3 with a history of 3 consecutive normal Pap tests.  HPV screening.** / Every 3 years from ages 24 through ages 1 to 46 with a history of 3 consecutive normal Pap tests.  Hepatitis C blood test.** / For any individual with known risks for hepatitis C.  Skin self-exam. / Monthly.  Influenza vaccine. / Every year.  Tetanus, diphtheria, and acellular pertussis (Tdap, Td) vaccine.** / Consult your health care provider. Pregnant women should receive 1 dose of Tdap vaccine during each pregnancy. 1 dose of Td every 10 years.  Varicella vaccine.** / Consult your health care provider. Pregnant females who do not have evidence of immunity should receive the first dose after pregnancy.  HPV vaccine. / 3 doses over 6 months, if 72 and younger. The vaccine is not recommended for use in pregnant females. However, pregnancy testing is not needed before receiving a dose.  Measles, mumps, rubella (MMR) vaccine.** / You need at least 1 dose of MMR if you were born in 1957 or later. You may also need a 2nd dose. For females of childbearing age, rubella immunity should be determined. If there is no evidence of immunity, females who are not pregnant should be vaccinated. If there is no evidence of immunity, females who are pregnant should delay immunization until after pregnancy.  Pneumococcal 13-valent conjugate (PCV13) vaccine.** / Consult your health care provider.  Pneumococcal polysaccharide (PPSV23) vaccine.** / 1 to 2 doses if you smoke cigarettes or if you have certain conditions.  Meningococcal vaccine.** /  1 dose if you are age 19 to 21 years and a first-year college  student living in a residence hall, or have one of several medical conditions, you need to get vaccinated against meningococcal disease. You may also need additional booster doses.  Hepatitis A vaccine.** / Consult your health care provider.  Hepatitis B vaccine.** / Consult your health care provider.  Haemophilus influenzae type b (Hib) vaccine.** / Consult your health care provider. Ages 40 to 64 years  Blood pressure check.** / Every 1 to 2 years.  Lipid and cholesterol check.** / Every 5 years beginning at age 20 years.  Lung cancer screening. / Every year if you are aged 55-80 years and have a 30-pack-year history of smoking and currently smoke or have quit within the past 15 years. Yearly screening is stopped once you have quit smoking for at least 15 years or develop a health problem that would prevent you from having lung cancer treatment.  Clinical breast exam.** / Every year after age 40 years.  BRCA-related cancer risk assessment.** / For women who have family members with a BRCA-related cancer (breast, ovarian, tubal, or peritoneal cancers).  Mammogram.** / Every year beginning at age 40 years and continuing for as long as you are in good health. Consult with your health care provider.  Pap test.** / Every 3 years starting at age 30 years through age 65 or 70 years with a history of 3 consecutive normal Pap tests.  HPV screening.** / Every 3 years from ages 30 years through ages 65 to 70 years with a history of 3 consecutive normal Pap tests.  Fecal occult blood test (FOBT) of stool. / Every year beginning at age 50 years and continuing until age 75 years. You may not need to do this test if you get a colonoscopy every 10 years.  Flexible sigmoidoscopy or colonoscopy.** / Every 5 years for a flexible sigmoidoscopy or every 10 years for a colonoscopy beginning at age 50 years and continuing until age 75 years.  Hepatitis C blood test.** / For all people born from 1945 through  1965 and any individual with known risks for hepatitis C.  Skin self-exam. / Monthly.  Influenza vaccine. / Every year.  Tetanus, diphtheria, and acellular pertussis (Tdap/Td) vaccine.** / Consult your health care provider. Pregnant women should receive 1 dose of Tdap vaccine during each pregnancy. 1 dose of Td every 10 years.  Varicella vaccine.** / Consult your health care provider. Pregnant females who do not have evidence of immunity should receive the first dose after pregnancy.  Zoster vaccine.** / 1 dose for adults aged 60 years or older.  Measles, mumps, rubella (MMR) vaccine.** / You need at least 1 dose of MMR if you were born in 1957 or later. You may also need a 2nd dose. For females of childbearing age, rubella immunity should be determined. If there is no evidence of immunity, females who are not pregnant should be vaccinated. If there is no evidence of immunity, females who are pregnant should delay immunization until after pregnancy.  Pneumococcal 13-valent conjugate (PCV13) vaccine.** / Consult your health care provider.  Pneumococcal polysaccharide (PPSV23) vaccine.** / 1 to 2 doses if you smoke cigarettes or if you have certain conditions.  Meningococcal vaccine.** / Consult your health care provider.  Hepatitis A vaccine.** / Consult your health care provider.  Hepatitis B vaccine.** / Consult your health care provider.  Haemophilus influenzae type b (Hib) vaccine.** / Consult your health care provider. Ages 65   years and over  Blood pressure check.** / Every 1 to 2 years.  Lipid and cholesterol check.** / Every 5 years beginning at age 22 years.  Lung cancer screening. / Every year if you are aged 73-80 years and have a 30-pack-year history of smoking and currently smoke or have quit within the past 15 years. Yearly screening is stopped once you have quit smoking for at least 15 years or develop a health problem that would prevent you from having lung cancer  treatment.  Clinical breast exam.** / Every year after age 4 years.  BRCA-related cancer risk assessment.** / For women who have family members with a BRCA-related cancer (breast, ovarian, tubal, or peritoneal cancers).  Mammogram.** / Every year beginning at age 40 years and continuing for as long as you are in good health. Consult with your health care provider.  Pap test.** / Every 3 years starting at age 9 years through age 34 or 91 years with 3 consecutive normal Pap tests. Testing can be stopped between 65 and 70 years with 3 consecutive normal Pap tests and no abnormal Pap or HPV tests in the past 10 years.  HPV screening.** / Every 3 years from ages 57 years through ages 64 or 45 years with a history of 3 consecutive normal Pap tests. Testing can be stopped between 65 and 70 years with 3 consecutive normal Pap tests and no abnormal Pap or HPV tests in the past 10 years.  Fecal occult blood test (FOBT) of stool. / Every year beginning at age 15 years and continuing until age 17 years. You may not need to do this test if you get a colonoscopy every 10 years.  Flexible sigmoidoscopy or colonoscopy.** / Every 5 years for a flexible sigmoidoscopy or every 10 years for a colonoscopy beginning at age 86 years and continuing until age 71 years.  Hepatitis C blood test.** / For all people born from [birth year] through 1964 and any individual with known risks for hepatitis C.  Osteoporosis screening.** / A one-time screening for women ages 83 years and over and women at risk for fractures or osteoporosis.  Skin self-exam. / Monthly.  Influenza vaccine. / Every year.  Tetanus, diphtheria, and acellular pertussis (Tdap/Td) vaccine.** / 1 dose of Td every 10 years.  Varicella vaccine.** / Consult your health care provider.  Zoster vaccine.** / 1 dose for adults aged 61 years or older.  Pneumococcal 13-valent conjugate (PCV13) vaccine.** / Consult your health care provider.  Pneumococcal  polysaccharide (PPSV23) vaccine.** / 1 dose for all adults aged 28 years and older.  Meningococcal vaccine.** / Consult your health care provider.  Hepatitis A vaccine.** / Consult your health care provider.  Hepatitis B vaccine.** / Consult your health care provider.  Haemophilus influenzae type b (Hib) vaccine.** / Consult your health care provider. ** Family history and personal history of risk and conditions may change your health care provider's recommendations. Document Released: 09/28/2001 Document Revised: 12/17/2013 Document Reviewed: 12/28/2010 [facility] Patient Information 2015 [place], [place]. This information is not intended to replace advice given to you by your health care provider. Make sure you discuss any questions you have with your health care provider.

## 2014-09-20 NOTE — Progress Notes (Signed)
     Subjective:     Dawn Banks is a 29 y.o. [phone] female who presents for a postpartum visit. She is 6 weeks postpartum following a VBAC. I have fully reviewed the prenatal and intrapartum course; had GDM. The delivery was at 39.3 gestational weeks.  Anesthesia: epidural. Postpartum course has been uncomplicated. Baby's course has been uncomplicated. Baby is feeding by breast. Bleeding no bleeding. Bowel function is normal. Bladder function is normal. Patient is sexually active. Contraception method is rhythm method. Postpartum depression screening: negative. Of note, phone interpreter used  The following portions of the patient's history were reviewed and updated as appropriate: allergies, current medications, past family history, past medical history, past social history, past surgical history and problem list.  Normal pap and negative HPV in 02/2014.  Review of Systems Pertinent items are noted in HPI.   Objective:    BP 115/69 mmHg  Pulse 87  Temp(Src) 98.4 F (36.9 C) (Oral)  Wt 136 lb 8 oz (61.916 kg)  Breastfeeding? Yes  General:  alert and no distress   Breasts:  inspection negative, no nipple discharge or bleeding, no masses or nodularity palpable  Lungs: clear to auscultation bilaterally  Heart:  regular rate and rhythm  Abdomen: soft, non-tender; bowel sounds normal; no masses,  no organomegaly  Pelvic:  deferred        Assessment:   Normal postpartum exam. Pap smear not done at today's visit.   Plan:    1. Contraception: rhythm method. Plans to get pregnant soon. Encouraged birth spacing, but also told her to continue prenatal vitamins for now for breastfeeding and in preparation for next pregnancy. 2. Follow up for 2 hr GTT given history of GDM.    Jaynie CollinsUGONNA  Lylith Bebeau, MD, FACOG Attending Obstetrician & Gynecologist Faculty Practice, [facility]

## 2014-09-20 NOTE — Progress Notes (Signed)
[employer] interpreter ID# [id number] used for this encounter.

## 2014-09-24 ENCOUNTER — Other Ambulatory Visit: Payer: Medicaid Other

## 2014-09-24 DIAGNOSIS — Z8632 Personal history of gestational diabetes: Secondary | ICD-10-CM

## 2014-09-24 LAB — GLUCOSE TOLERANCE, 2 HOURS
Glucose, 2 hour: 94 mg/dL (ref 70–139)
Glucose, Fasting: 75 mg/dL (ref 70–99)

## 2014-09-26 ENCOUNTER — Telehealth: Payer: Self-pay

## 2014-09-26 NOTE — Telephone Encounter (Signed)
-----   Message from Levie HeritageJacob J Stinson, DO sent at 09/24/2014 11:44 PM EST ----- Normal 2hr GTT

## 2014-09-26 NOTE — Telephone Encounter (Signed)
Attempted to contact patient. No answer. Left message stating most recent lab in clinic was normal, call with any questions or concerns. Will also send letter.

## 2015-07-18 IMAGING — US US OB FOLLOW-UP
1 series · 12 of 28 positions shown · non-contrast
Comparison: none

[Series 1: us ob follow up · 61 acquisitions, 12 frames shown]
[im 3/61]
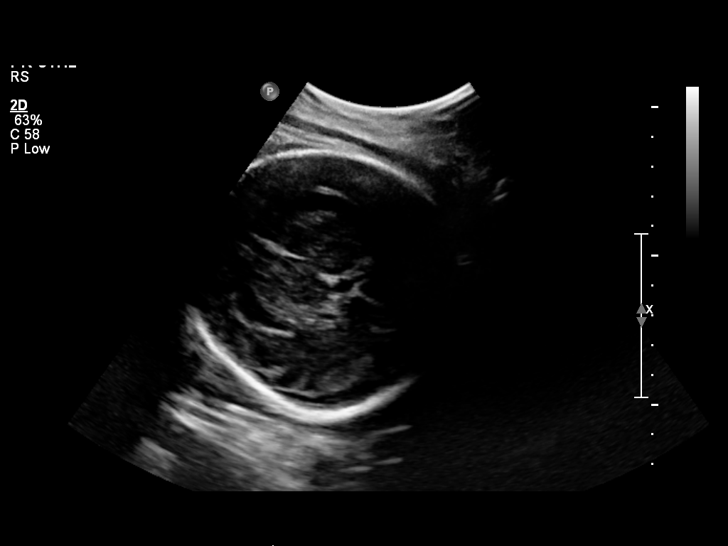
[im 7/61]
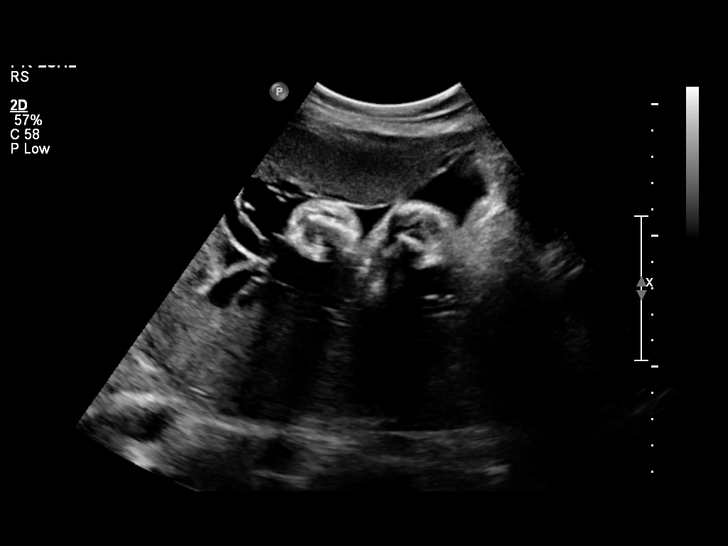
[im 12/61]
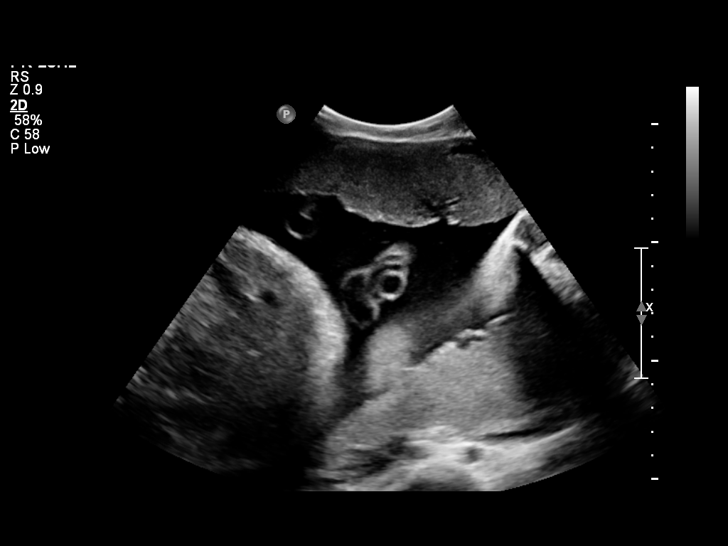
[im 18/61]
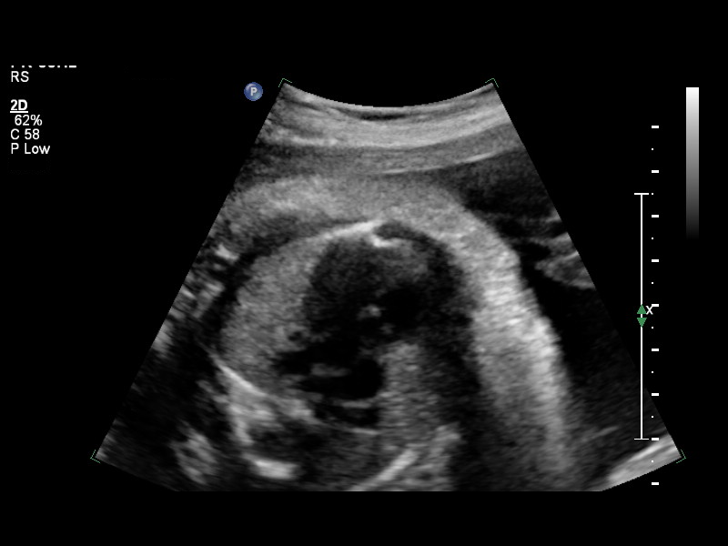
[im 23/61]
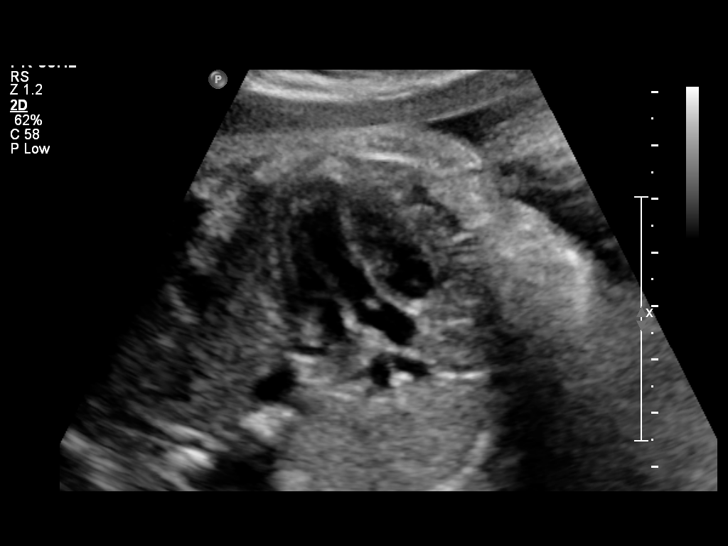
[im 27/61]
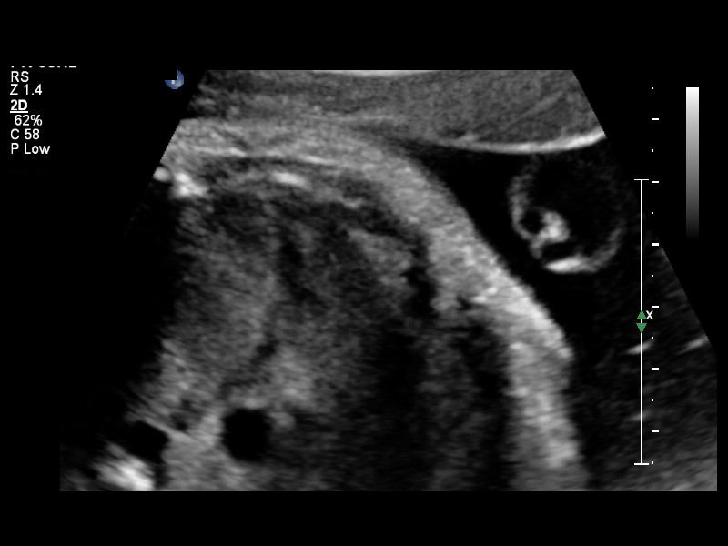
[im 34/61]
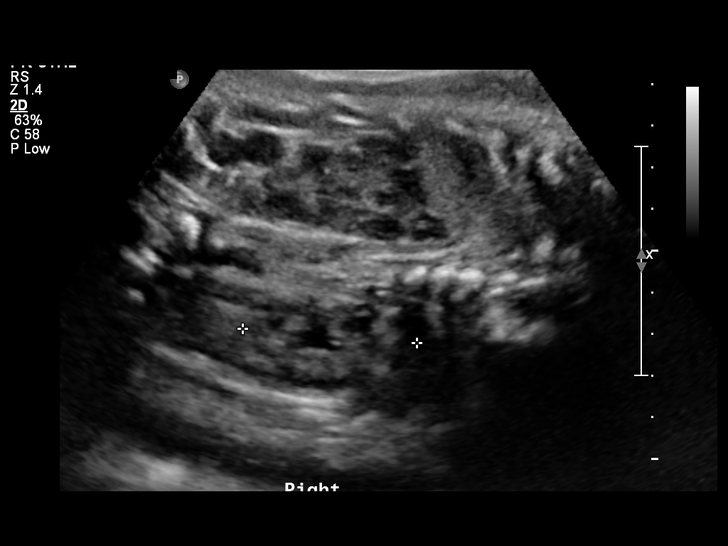
[im 38/61]
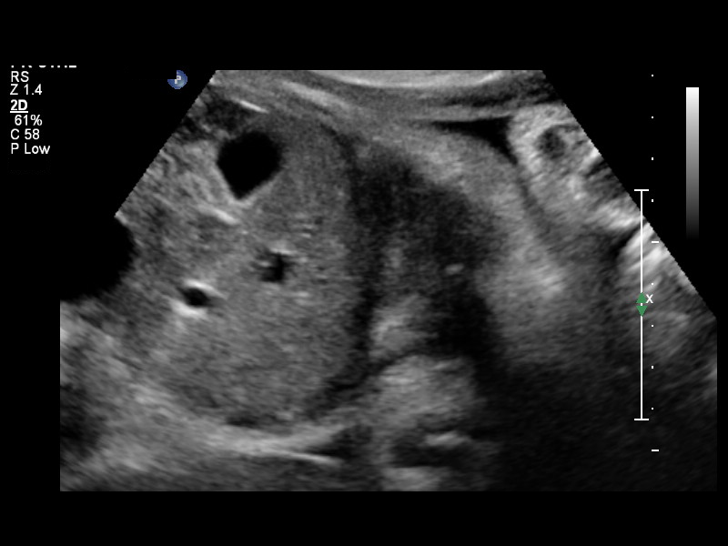
[im 43/61]
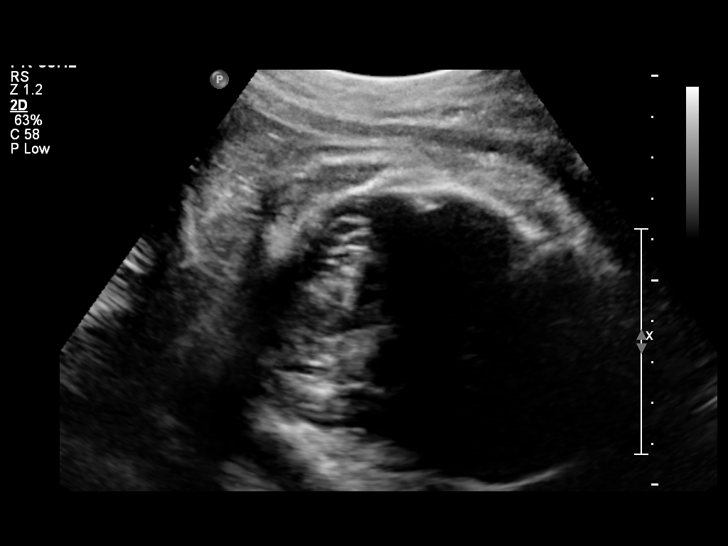
[im 49/61]
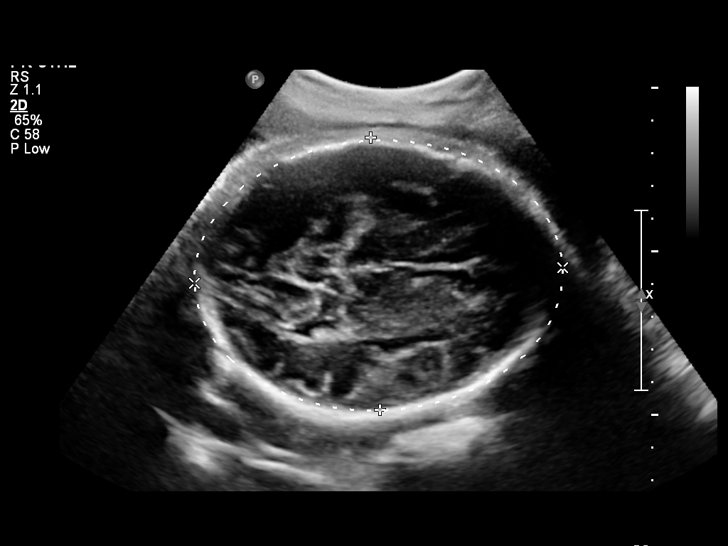
[im 54/61]
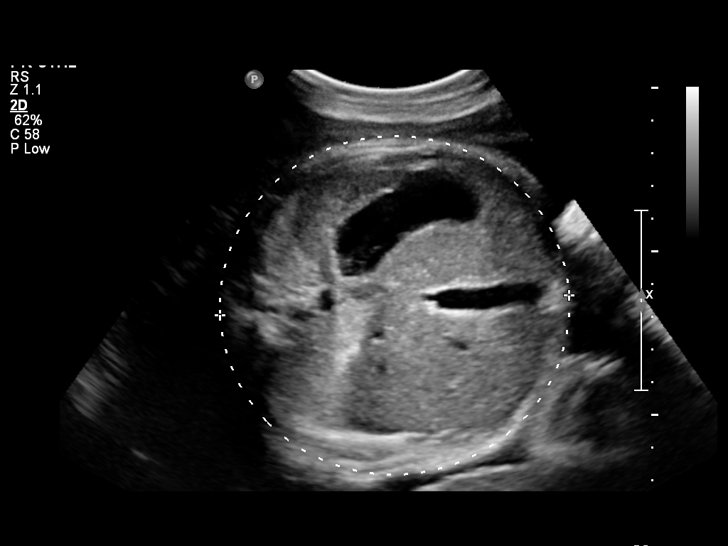
[im 58/61]
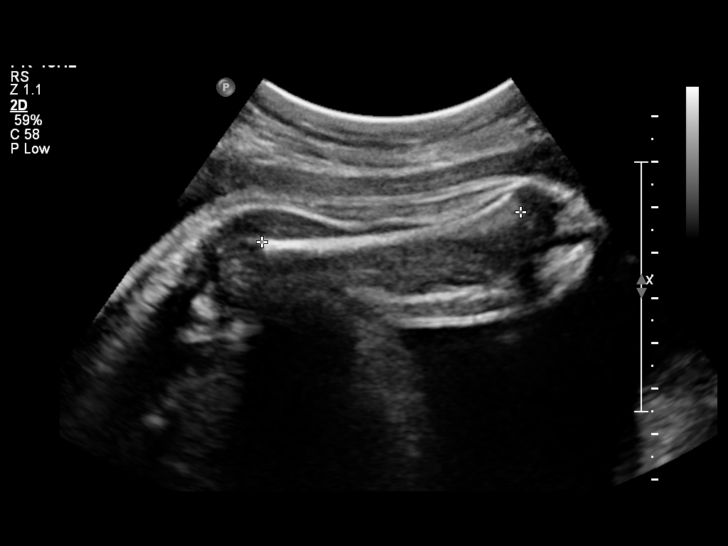

[12 of 28 positions shown; findings below may reference images not displayed]

OBSTETRICS REPORT
                      (Signed Final 06/24/2014 [DATE])

Service(s) Provided

 US OB FOLLOW UP                                       76816.1
Indications

 Cardiac (heart) anomaly, pericardial fluid
 Previous cesarean section
 Gestational diabetes in pregnancy, unspecified
 control
 33 weeks gestation of pregnancy
Fetal Evaluation

 Num Of Fetuses:    1
 Fetal Heart Rate:  155                          bpm
 Cardiac Activity:  Observed
 Presentation:      Cephalic
 Placenta:          Fundal/ right lateral,
                    above cervical os
 P. Cord            Visualized, central
 Insertion:

 Amniotic Fluid
 AFI FV:      Subjectively within normal limits
 AFI Sum:     12.71   cm       38  %Tile     Larg Pckt:    4.05  cm
 RUQ:   2.24    cm   RLQ:    4.05   cm    LUQ:   2.89    cm   LLQ:    3.53   cm
Biometry

 BPD:     82.2  mm     G. Age:  33w 1d                CI:        72.85   70 - 86
                                                      FL/HC:      21.1   19.9 -

 HC:     306.2  mm     G. Age:  34w 1d       38  %    HC/AC:      0.94   0.96 -

 AC:     326.7  mm     G. Age:  36w 4d     > 97  %    FL/BPD:     78.7   71 - 87
 FL:      64.7  mm     G. Age:  33w 3d       45  %    FL/AC:      19.8   20 - 24
 HUM:     57.3  mm     G. Age:  33w 2d       61  %

 Est. FW:    3445  gm    5 lb 12 oz      86  %
Gestational Age

 LMP:           33w 1d        Date:  11/04/13                 EDD:   08/11/14
 U/S Today:     34w 2d                                        EDD:   08/03/14
 Best:          33w 1d     Det. By:  LMP  (11/04/13)          EDD:   08/11/14
Anatomy

 Cranium:          Appears normal         Ductal Arch:      Previously seen
 Fetal Cavum:      Appears normal         Diaphragm:        Appears normal
 Ventricles:       Appears normal         Stomach:          Appears normal, left
                                                            sided
 Choroid Plexus:   Previously seen        Abdomen:          Appears normal
 Cerebellum:       Previously seen        Abdominal Wall:   Previously seen
 Posterior Fossa:  Previously seen        Cord Vessels:     Previously seen
 Face:             Orbits and profile     Kidneys:          Appear normal
                   previously seen
 Lips:             Previously seen        Bladder:          Appears normal
 Heart:            Appears normal         Spine:            Previously seen
                   (4CH, axis, and
                   situs)
 RVOT:             Previously seen        Lower             Previously seen
                                          Extremities:
 LVOT:             Appears normal         Upper             Previously seen
                                          Extremities:
 Aortic Arch:      Previously seen

 Other:  Fetus appears to be a female. Technically difficult due to advanced
         gestational age.
Cervix Uterus Adnexa

 Cervix:       Not visualized (advanced GA >70wks)
 Uterus:       No abnormality visualized.

 Left Ovary:    Not visualized.
 Right Ovary:   Not visualized.
 Adnexa:     No adnexal mass visualized.
Impression

 Single IUP at 33w 1d
 Gestational diabetes- recently diagnosed
 The estimated fetal weight is at the 86th %tile.
 The AC measures > 97th%tile.
 The previously noted pericardial effusion is not appreciated
 on today's study
 Normal amniotic fluid volume
Recommendations

 Recommend follow-up ultrasound examination in 4 weeks for
 growth due to gestational diabetes

## 2015-08-23 IMAGING — US US OB FOLLOW-UP
1 series · 12 of 28 positions shown · non-contrast
Comparison: none

[Series 1: us ob follow-up · 0.14mm/px · 12 of 32 slices shown]
[im 2/32]
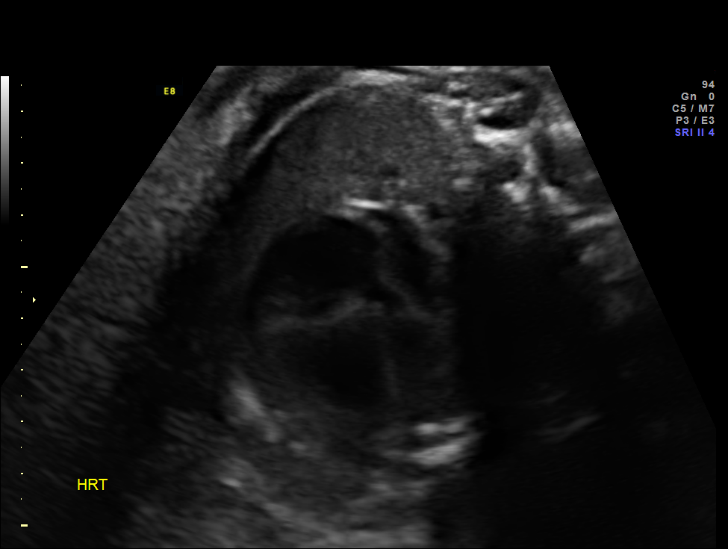
[im 4/32]
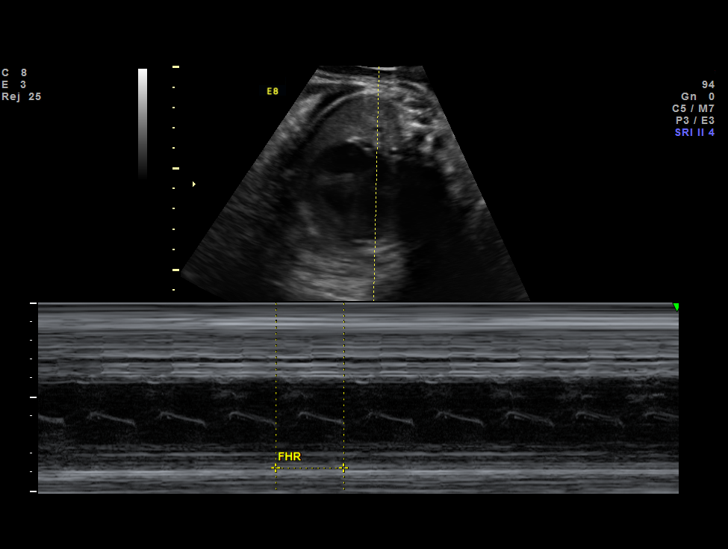
[im 6/32]
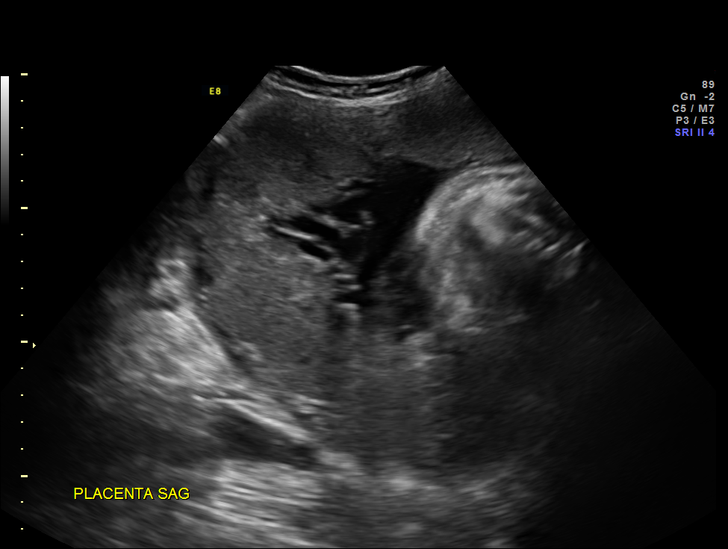
[im 10/32]
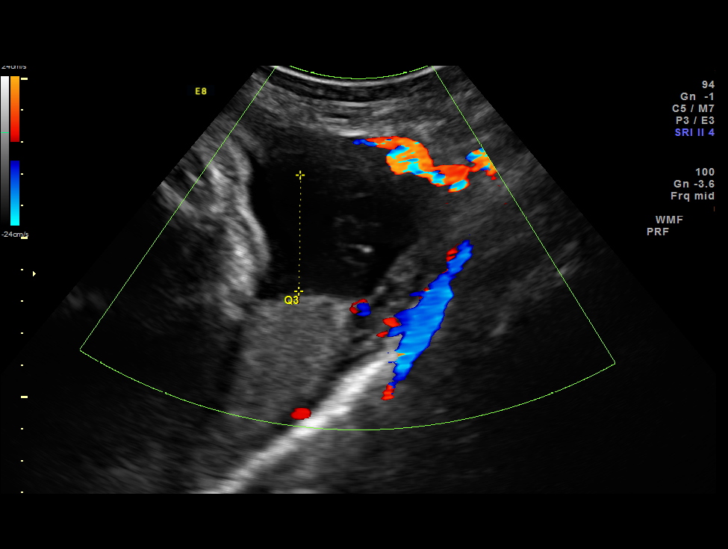
[im 12/32]
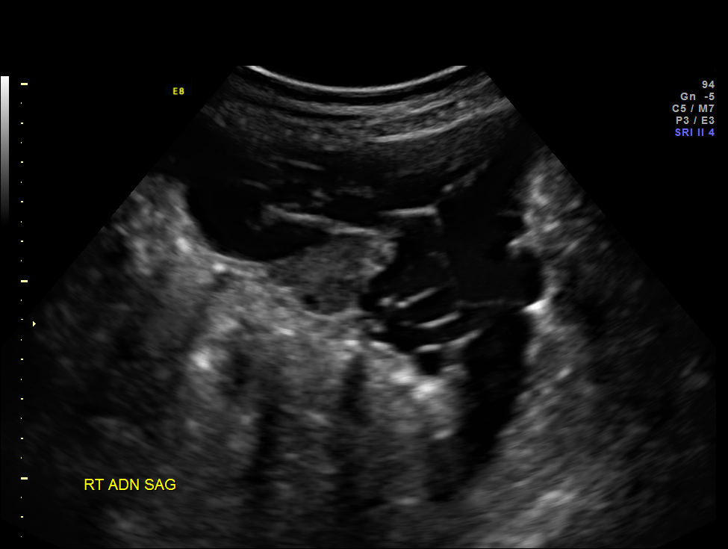
[im 14/32]
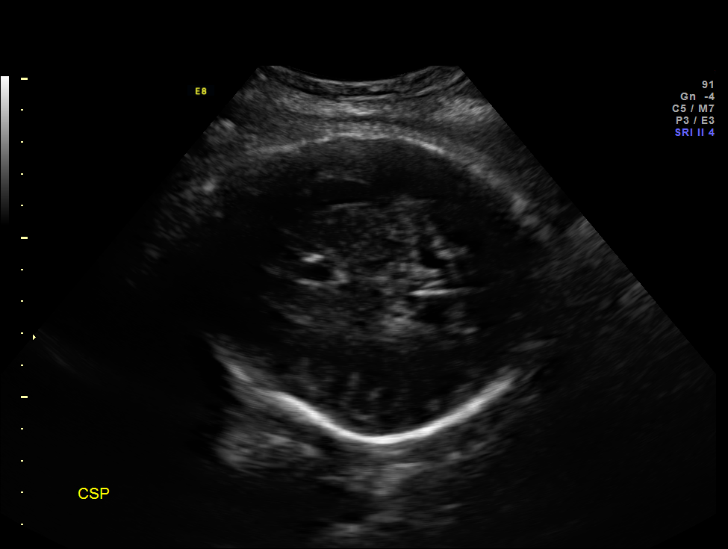
[im 18/32]
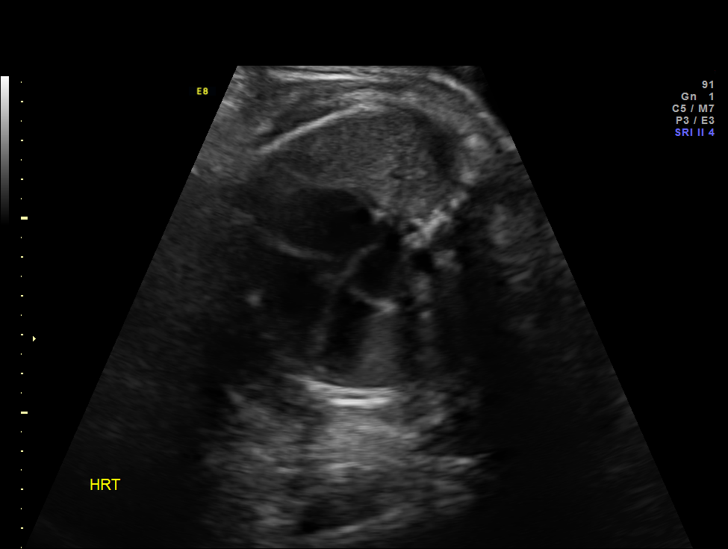
[im 20/32]
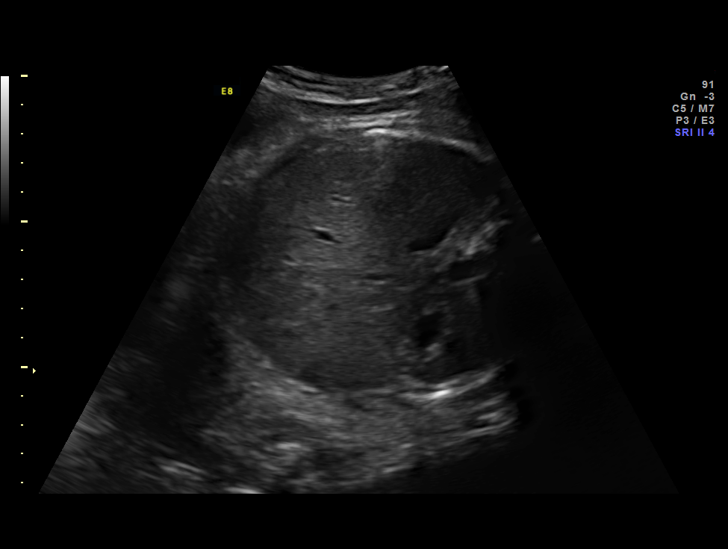
[im 22/32]
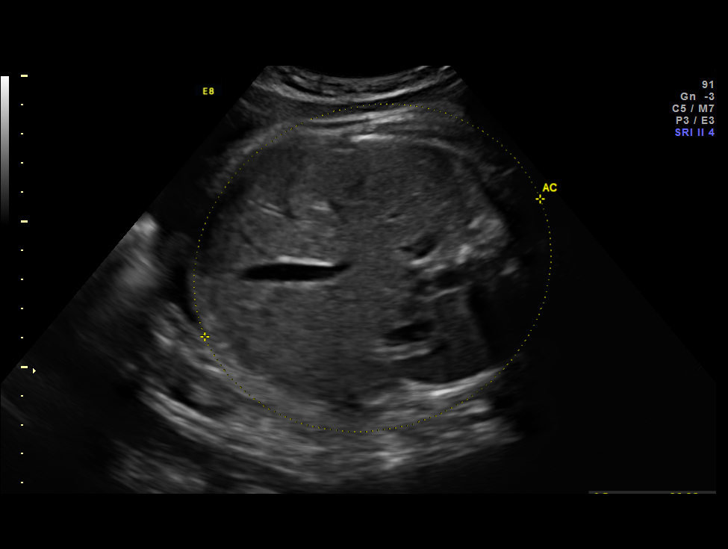
[im 26/32]
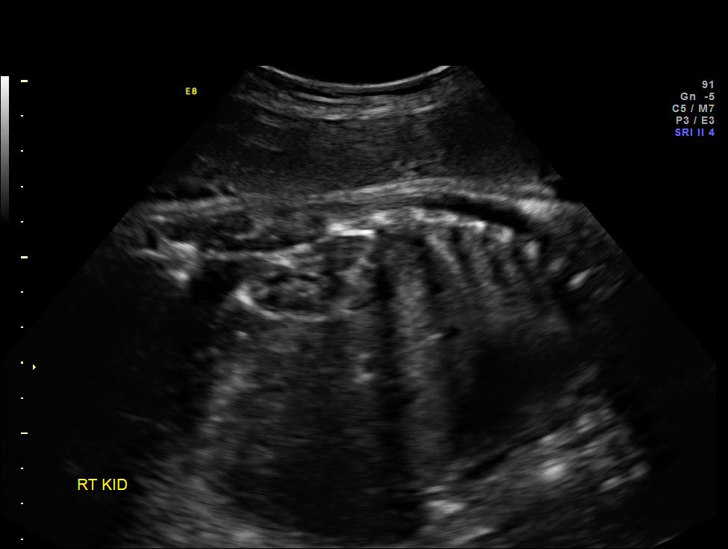
[im 28/32]
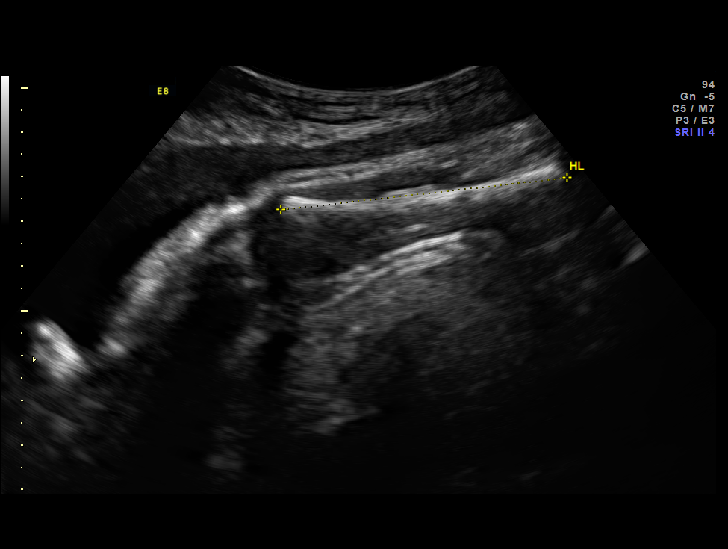
[im 30/32]
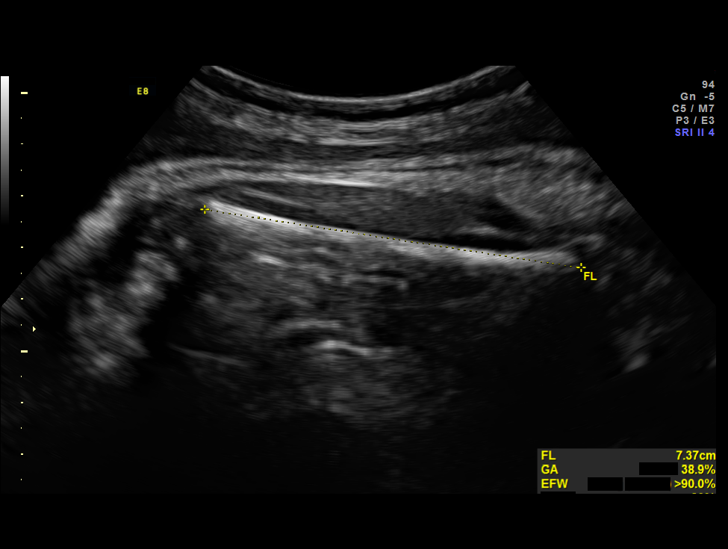

[12 of 28 positions shown; findings below may reference images not displayed]

OBSTETRICS REPORT
                      (Signed Final 07/30/2014 [DATE])

Service(s) Provided

 US OB FOLLOW UP                                       76816.1
Indications

 Previous cesarean section
 Gestational diabetes in pregnancy, unspecified
 control
 38 weeks gestation of pregnancy
Fetal Evaluation

 Num Of Fetuses:    1
 Fetal Heart Rate:  148                          bpm
 Cardiac Activity:  Observed
 Presentation:      Cephalic
 Placenta:          Fundal left, above cervical
                    os
 P. Cord            Previously Visualized
 Insertion:

 Amniotic Fluid
 AFI FV:      Subjectively within normal limits
 AFI Sum:     13.04   cm       50  %Tile     Larg Pckt:    4.37  cm
 RUQ:   1.95    cm   RLQ:    4.37   cm    LUQ:   3.07    cm   LLQ:    3.65   cm
Biometry

 BPD:     90.6  mm     G. Age:  36w 5d                CI:         74.0   70 - 86
 OFD:    122.5  mm                                    FL/HC:      21.2   20.9 -

 HC:     341.5  mm     G. Age:  39w 2d       58  %    HC/AC:      0.92   0.92 -

 AC:       370  mm     G. Age:  40w 6d     > 97  %    FL/BPD:     79.9   71 - 87
 FL:      72.4  mm     G. Age:  37w 1d       24  %    FL/AC:      19.6   20 - 24
 HUM:     63.9  mm     G. Age:  37w 0d       54  %

 Est. FW:    3111  gm      8 lb 5 oz   > 90  %
Gestational Age

 LMP:           38w 2d        Date:  11/04/13                 EDD:   08/11/14
 U/S Today:     38w 4d                                        EDD:   08/09/14
 Best:          38w 2d     Det. By:  LMP  (11/04/13)          EDD:   08/11/14
Anatomy

 Cranium:          Appears normal         Ductal Arch:      Previously seen
 Fetal Cavum:      Appears normal         Diaphragm:        Previously seen
 Ventricles:       Appears normal         Stomach:          Appears normal, left
                                                            sided
 Choroid Plexus:   Previously seen        Abdomen:          Appears normal
 Cerebellum:       Previously seen        Abdominal Wall:   Previously seen
 Posterior Fossa:  Previously seen        Cord Vessels:     Previously seen
 Face:             Orbits and profile     Kidneys:          Appear normal
                   previously seen
 Lips:             Previously seen        Bladder:          Appears normal
 Heart:            Previously seen        Spine:            Previously seen
 RVOT:             Previously seen        Lower             Previously seen
                                          Extremities:
 LVOT:             Previously seen        Upper             Previously seen
                                          Extremities:
 Aortic Arch:      Previously seen

 Other:  Female gender previously seen. Technically difficult due to advanced
         gestational age.
Cervix Uterus Adnexa

 Cervix:       Not visualized (advanced GA >93wks)
 Left Ovary:    Within normal limits.
 Right Ovary:   Within normal limits.

 Adnexa:     No abnormality visualized.
Impression

 SIUP at 38+2 weeks
 Normal interval anatomy; anatomic survey complete
 Normal amniotic fluid volume
 EFW > 90th %tile; AC > 97th %tile (3,777 grams; 8+5); fetus
 at risk to be LGA
Recommendations

 Follow-up as clinically indicated

## 2019-11-05 ENCOUNTER — Encounter: Payer: Self-pay | Admitting: *Deleted

## 2019-11-26 ENCOUNTER — Ambulatory Visit (INDEPENDENT_AMBULATORY_CARE_PROVIDER_SITE_OTHER): Payer: Medicaid Other | Admitting: *Deleted

## 2019-11-26 ENCOUNTER — Other Ambulatory Visit: Payer: Self-pay

## 2019-11-26 DIAGNOSIS — Z3490 Encounter for supervision of normal pregnancy, unspecified, unspecified trimester: Secondary | ICD-10-CM

## 2019-11-26 NOTE — Progress Notes (Signed)
[id number] called Dawn Banks with interpreter for new ob intake and left a message we are calling for telephone visit and will call again in a few minutes- please answer call.  Dawn Langhorst,RN  I connected with  Dawn Banks on 11/26/19 at  9:30 AM EDT by telephone and verified that I am speaking with the correct person using two identifiers.   I discussed the limitations, risks, security and privacy concerns of performing an evaluation and management service by telephone and the availability of in person appointments. I also discussed with the patient that there may be a patient responsible charge related to this service. The patient expressed understanding and agreed to proceed.   Per chart has started care in [facility] with Dawn Banks health.  She asked what this appointment was for and I clarified was to start prenatal care. I asked if she was was planning to continue prenatal care in [facility] or [place]. She states she called to cancel appointment with [place] Friday because she is going to continue her prenatal care in [facility]. I informed her I will have her new ob visit with [place] cancelled and wished her well with her pregnancy. She voices understanding.  Dawn Rahmani,RN 11/26/2019  9:48 AM

## 2019-12-03 ENCOUNTER — Encounter: Payer: Self-pay | Admitting: Family Medicine

## 2021-12-01 ENCOUNTER — Other Ambulatory Visit: Payer: Self-pay

## 2021-12-01 ENCOUNTER — Emergency Department (HOSPITAL_BASED_OUTPATIENT_CLINIC_OR_DEPARTMENT_OTHER)
Admission: EM | Admit: 2021-12-01 | Discharge: 2021-12-01 | Disposition: A | Discharge status: Home / Self Care | Payer: Medicaid Other | Attending: Emergency Medicine | Admitting: Emergency Medicine

## 2021-12-01 ENCOUNTER — Encounter (HOSPITAL_BASED_OUTPATIENT_CLINIC_OR_DEPARTMENT_OTHER): Payer: Self-pay | Admitting: Emergency Medicine

## 2021-12-01 ENCOUNTER — Emergency Department (HOSPITAL_BASED_OUTPATIENT_CLINIC_OR_DEPARTMENT_OTHER): Payer: Medicaid Other

## 2021-12-01 DIAGNOSIS — R1011 Right upper quadrant pain: Secondary | ICD-10-CM | POA: Insufficient documentation

## 2021-12-01 DIAGNOSIS — R1084 Generalized abdominal pain: Secondary | ICD-10-CM | POA: Diagnosis not present

## 2021-12-01 DIAGNOSIS — R1030 Lower abdominal pain, unspecified: Secondary | ICD-10-CM | POA: Diagnosis present

## 2021-12-01 DIAGNOSIS — R1032 Left lower quadrant pain: Secondary | ICD-10-CM | POA: Insufficient documentation

## 2021-12-01 LAB — COMPREHENSIVE METABOLIC PANEL WITH GFR
ALT: 12 U/L (ref 0–44)
AST: 17 U/L (ref 15–41)
Albumin: 3.7 g/dL (ref 3.5–5.0)
Alkaline Phosphatase: 69 U/L (ref 38–126)
Anion gap: 5 (ref 5–15)
BUN: 11 mg/dL (ref 6–20)
CO2: 23 mmol/L (ref 22–32)
Calcium: 8.2 mg/dL — ABNORMAL LOW (ref 8.9–10.3)
Chloride: 109 mmol/L (ref 98–111)
Creatinine, Ser: 0.72 mg/dL (ref 0.44–1.00)
GFR, Estimated: >60 mL/min
Glucose, Bld: 92 mg/dL (ref 70–99)
Potassium: 3.8 mmol/L (ref 3.5–5.1)
Sodium: 137 mmol/L (ref 135–145)
Total Bilirubin: 0.3 mg/dL (ref 0.3–1.2)
Total Protein: 6.5 g/dL (ref 6.5–8.1)

## 2021-12-01 LAB — URINALYSIS, ROUTINE W REFLEX MICROSCOPIC
Bilirubin Urine: NEGATIVE
Glucose, UA: NEGATIVE mg/dL
Hgb urine dipstick: NEGATIVE
Ketones, ur: NEGATIVE mg/dL
Leukocytes,Ua: NEGATIVE
Nitrite: NEGATIVE
Protein, ur: NEGATIVE mg/dL
Specific Gravity, Urine: 1.015 (ref 1.005–1.030)
pH: 7.5 (ref 5.0–8.0)

## 2021-12-01 LAB — CBC
HCT: 27.7 % — ABNORMAL LOW (ref 36.0–46.0)
Hemoglobin: 8.3 g/dL — ABNORMAL LOW (ref 12.0–15.0)
MCH: 20.5 pg — ABNORMAL LOW (ref 26.0–34.0)
MCHC: 30 g/dL (ref 30.0–36.0)
MCV: 68.4 fL — ABNORMAL LOW (ref 80.0–100.0)
Platelets: 214 10*3/uL (ref 150–400)
RBC: 4.05 MIL/uL (ref 3.87–5.11)
RDW: 19.8 % — ABNORMAL HIGH (ref 11.5–15.5)
WBC: 6.4 10*3/uL (ref 4.0–10.5)
nRBC: 0 % (ref 0.0–0.2)

## 2021-12-01 LAB — PREGNANCY, URINE: Preg Test, Ur: NEGATIVE

## 2021-12-01 LAB — LIPASE, BLOOD: Lipase: 33 U/L (ref 11–51)

## 2021-12-01 MED ORDER — KETOROLAC TROMETHAMINE 15 MG/ML IJ SOLN
15.0000 mg | Freq: Once | INTRAMUSCULAR | Status: AC
  Administered 2021-12-01: 15 mg via INTRAVENOUS
  Filled 2021-12-01: qty 1

## 2021-12-01 MED ORDER — OXYCODONE HCL 5 MG PO TABS: 5.0000 mg | ORAL_TABLET | Freq: Four times a day (QID) | ORAL | 0 refills | Status: AC | PRN

## 2021-12-01 MED ORDER — IOHEXOL 300 MG/ML  SOLN
100.0000 mL | Freq: Once | INTRAMUSCULAR | Status: AC | PRN
  Administered 2021-12-01: 100 mL via INTRAVENOUS

## 2021-12-01 NOTE — ED Provider Notes (Signed)
?[facility] EMERGENCY DEPARTMENT ?Provider Note ? ? ?CSN: [id number] ?Arrival date & time: 12/01/21  1740 ? ?  ? ?History ? ?Chief Complaint  ?Patient presents with  ? Abdominal Pain  ? ? ?Dawn Banks is a 36 y.o. female. ? ?Patient is a 36 year old female with no significant past medical history presenting for complaints of abdominal pain.  Patient admits lower abdominal pain started on Sunday and was intermittent however is now constant and worsening, 10 out of 10 pain severity, or any other associated symptoms.  Patient denies nausea, vomiting, diarrhea, fevers, chills.  She has never had pain like this in the past.  Denies vaginal discharge, dysuria, vaginal bleeding, or hematuria. ? ?The history is provided by the patient. No language interpreter was used.  ?Abdominal Pain ?Associated symptoms: no chest pain, no chills, no cough, no dysuria, no fever, no hematuria, no shortness of breath, no sore throat and no vomiting   ? ?  ? ?Home Medications ?Prior to Admission medications   ?Medication Sig Start Date End Date Taking? Authorizing Provider  ?oxyCODONE (ROXICODONE) 5 MG immediate release tablet Take 1 tablet (5 mg total) by mouth every 6 (six) hours as needed for severe pain. 12/01/21  Yes Franne FortsGray, Alberta Lenhard P, DO  ?   ? ?Allergies    ?Prenatal vitamins   ? ?Review of Systems   ?Review of Systems  ?Constitutional:  Negative for chills and fever.  ?HENT:  Negative for ear pain and sore throat.   ?Eyes:  Negative for pain and visual disturbance.  ?Respiratory:  Negative for cough and shortness of breath.   ?Cardiovascular:  Negative for chest pain and palpitations.  ?Gastrointestinal:  Positive for abdominal pain. Negative for vomiting.  ?Genitourinary:  Negative for dysuria and hematuria.  ?Musculoskeletal:  Negative for arthralgias and back pain.  ?Skin:  Negative for color change and rash.  ?Neurological:  Negative for seizures and syncope.  ?All other systems reviewed and are negative. ? ?Physical  Exam ?Updated Vital Signs ?BP 113/75   Pulse 67   Temp 98.2 ?F (36.8 ?C) (Oral)   Resp 16   LMP 11/23/2021 Comment: Negative pregnancy test in ED today  SpO2 100%  ?Physical Exam ?Vitals and nursing note reviewed.  ?Constitutional:   ?   General: She is not in acute distress. ?   Appearance: She is well-developed.  ?HENT:  ?   Head: Normocephalic and atraumatic.  ?Eyes:  ?   Conjunctiva/sclera: Conjunctivae normal.  ?Cardiovascular:  ?   Rate and Rhythm: Normal rate and regular rhythm.  ?   Heart sounds: No murmur heard. ?Pulmonary:  ?   Effort: Pulmonary effort is normal. No respiratory distress.  ?   Breath sounds: Normal breath sounds.  ?Abdominal:  ?   Palpations: Abdomen is soft.  ?   Tenderness: There is abdominal tenderness in the right upper quadrant, suprapubic area and left lower quadrant. There is guarding. There is no rebound.  ?Musculoskeletal:     ?   General: No swelling.  ?   Cervical back: Neck supple.  ?Skin: ?   General: Skin is warm and dry.  ?   Capillary Refill: Capillary refill takes less than 2 seconds.  ?Neurological:  ?   Mental Status: She is alert.  ?Psychiatric:     ?   Mood and Affect: Mood normal.  ? ? ?ED Results / Procedures / Treatments   ?Labs ?(all labs ordered are listed, but only abnormal results are displayed) ?Labs Reviewed  ?  COMPREHENSIVE METABOLIC PANEL - Abnormal; Notable for the following components:  ?    Result Value  ? Calcium 8.2 (*)   ? All other components within normal limits  ?CBC - Abnormal; Notable for the following components:  ? Hemoglobin 8.3 (*)   ? HCT 27.7 (*)   ? MCV 68.4 (*)   ? MCH 20.5 (*)   ? RDW 19.8 (*)   ? All other components within normal limits  ?URINALYSIS, ROUTINE W REFLEX MICROSCOPIC - Abnormal; Notable for the following components:  ? Color, Urine STRAW (*)   ? All other components within normal limits  ?LIPASE, BLOOD  ?PREGNANCY, URINE  ? ? ?EKG ?None ? ?Radiology ?CT ABDOMEN PELVIS W CONTRAST ? ?Result Date: 12/01/2021 ?CLINICAL DATA:   Suprapubic/left lower quadrant pain EXAM: CT ABDOMEN AND PELVIS WITH CONTRAST TECHNIQUE: Multidetector CT imaging of the abdomen and pelvis was performed using the standard protocol following bolus administration of intravenous contrast. RADIATION DOSE REDUCTION: This exam was performed according to the departmental dose-optimization program which includes automated exposure control, adjustment of the mA and/or kV according to patient size and/or use of iterative reconstruction technique. CONTRAST:  OMNIPAQUE IOHEXOL 300 MG/ML  SOLN COMPARISON:  None. FINDINGS: Lower chest: No acute abnormality. Hepatobiliary: No focal liver abnormality is seen. No gallstones, gallbladder wall thickening, or biliary dilatation. Pancreas: Unremarkable. No pancreatic ductal dilatation or surrounding inflammatory changes. Spleen: Normal in size without focal abnormality. Adrenals/Urinary Tract: Adrenal glands are unremarkable. Kidneys are normal, without renal calculi, focal lesion, or hydronephrosis. Bladder is unremarkable. Stomach/Bowel: Stomach is within normal limits. Appendix appears normal. No evidence of bowel wall thickening, distention, or inflammatory changes. Vascular/Lymphatic: No significant vascular findings are present. No enlarged abdominal or pelvic lymph nodes. Reproductive: Uterus and bilateral adnexa are unremarkable. Other: No abdominal wall hernia or abnormality. No abdominopelvic ascites. Musculoskeletal: No acute or significant osseous findings. IMPRESSION: No acute findings in the abdomen or pelvis. Electronically Signed   By: Allegra Lai M.D.   On: 12/01/2021 20:25   ? ?Procedures ?Procedures  ? ? ?Medications Ordered in ED ?Medications  ?ketorolac (TORADOL) 15 MG/ML injection 15 mg (15 mg Intravenous Given 12/01/21 1938)  ?iohexol (OMNIPAQUE) 300 MG/ML solution 100 mL (100 mLs Intravenous Contrast Given 12/01/21 2003)  ? ? ?ED Course/ Medical Decision Making/ A&P ?  ?                        ?Medical  Decision Making ?Amount and/or Complexity of Data Reviewed ?Labs: ordered. ?Radiology: ordered. ? ?Risk ?Prescription drug management. ? ? ?43:40 AM ?36 year old female with no significant past medical history presenting for complaints of abdominal pain.  Patient is alert and oriented x3, no acute distress, afebrile, stable vital signs.  Physical exam demonstrates soft abdomen with tenderness to palpation in the suprapubic region, left lower quadrant, and right upper quadrant. Abdomen is soft without rigidity. Serious etiologies of diffuse abdominal pain considered including but not limited to appendicitis, bowel perforation, diverticulitis, pancreatitis, cholelithiasis.  ? ?Laboratory studies are stable with no signs or symptoms of sepsis.  Stable liver profile, renal function, and lipase.  CT abdomen and pelvis demonstrates no acute process. UA negative. Pregnancy negative.  ? ?Unable to identify patients source of abdominal pain at this time. Patient recommended for motrin/tylenol and rest. Patient in no distress and overall condition improved here in the ED. Detailed discussions were had with the patient regarding current findings, and need for close f/u with  PCP or on call doctor. The patient has been instructed to return immediately if the symptoms worsen in any way for re-evaluation. Patient verbalized understanding and is in agreement with current care plan. All questions answered prior to discharge. ? ? ? ? ? ? ? ?Final Clinical Impression(s) / ED Diagnoses ?Final diagnoses:  ?Generalized abdominal pain  ? ? ?Rx / DC Orders ?ED Discharge Orders   ? ?      Ordered  ?  oxyCODONE (ROXICODONE) 5 MG immediate release tablet  Every 6 hours PRN       ? 12/01/21 2054  ? ?  ?  ? ?  ? ? ?  ?Franne Forts, DO ?12/03/21 0257 ? ?

## 2021-12-01 NOTE — ED Triage Notes (Addendum)
Pt reports LLQ abdominal pain that started Sunday. Pt denies N/V/D also reports chills.  ?

## 2022-12-25 IMAGING — CT CT ABD-PELV W/ CM
2 of 4 series · 16 of 46 positions shown, 18 images · IV contrast (Omnipaque)
Comparison: None.

CLINICAL DATA: Suprapubic/left lower quadrant pain

EXAM:
CT ABDOMEN AND PELVIS WITH CONTRAST
TECHNIQUE: Multidetector CT imaging of the abdomen and pelvis was performed
using the standard protocol following bolus administration of
intravenous contrast.

[Series 3: axial st · axial · 0.98mm/px · z∈[-715,-325]mm · 13 of 86 slices shown, 15 images]
[im 4/86  soft-tissue]
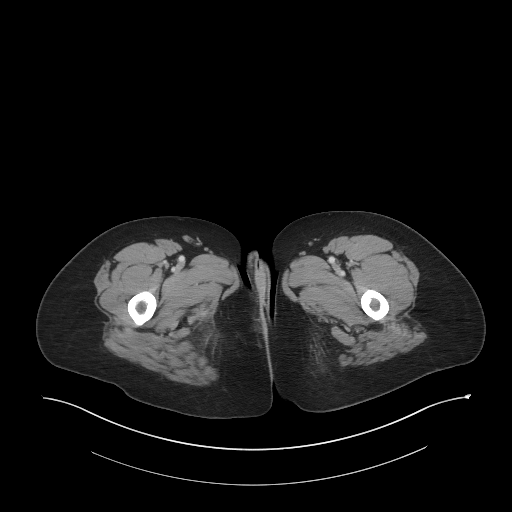
[im 4/86  bone]
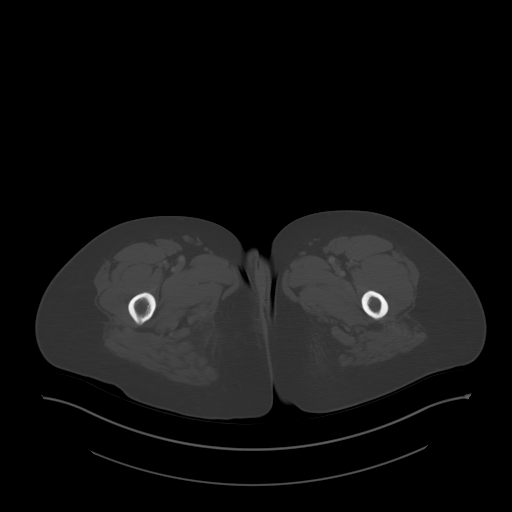
[im 11/86  soft-tissue]
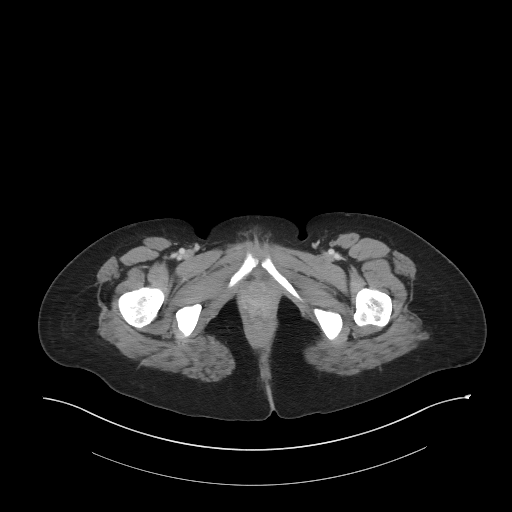
[im 18/86  soft-tissue]
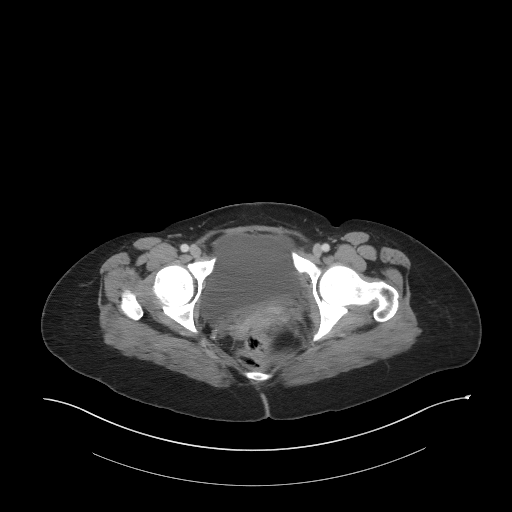
[im 24/86  soft-tissue]
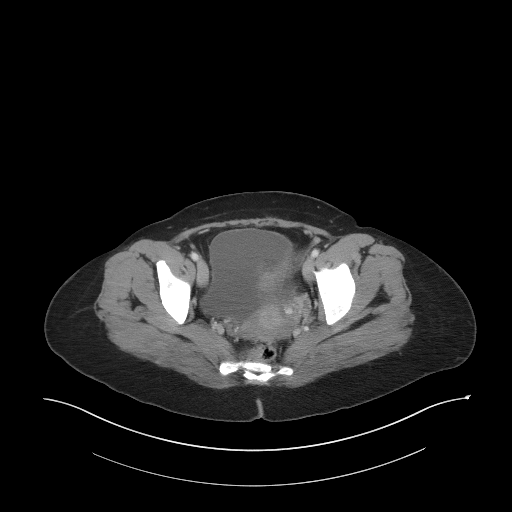
[im 31/86  soft-tissue]
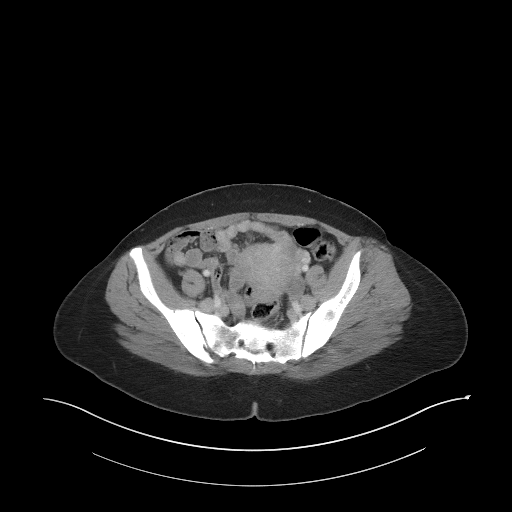
[im 38/86  soft-tissue]
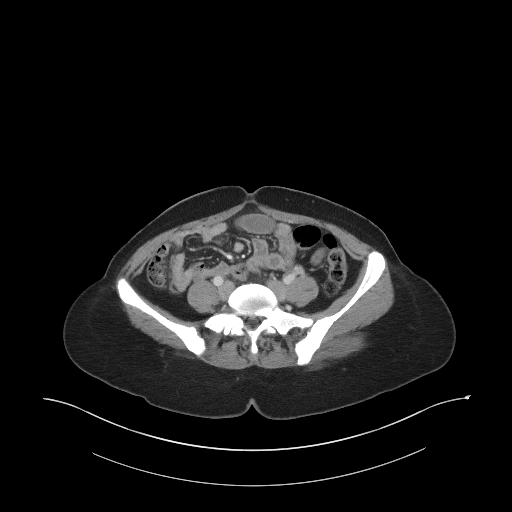
[im 45/86  soft-tissue]
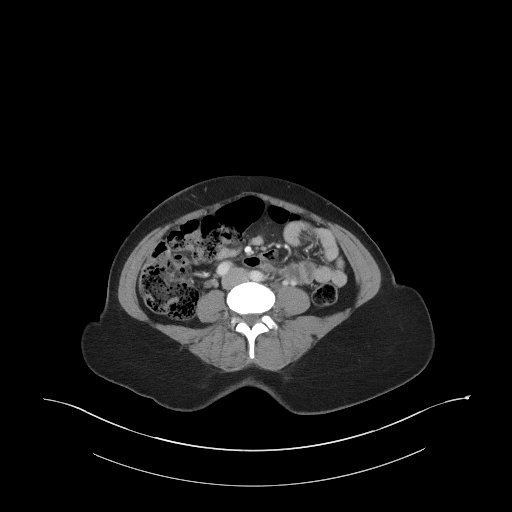
[im 48/86  soft-tissue]
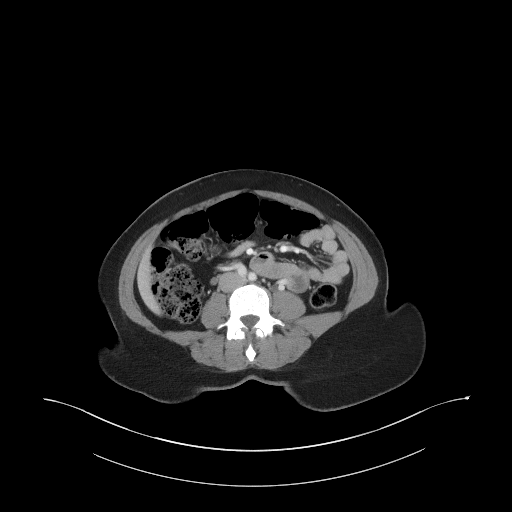
[im 55/86  soft-tissue]
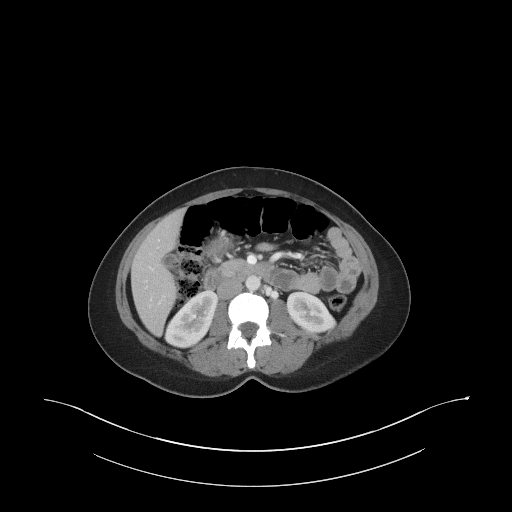
[im 55/86  bone]
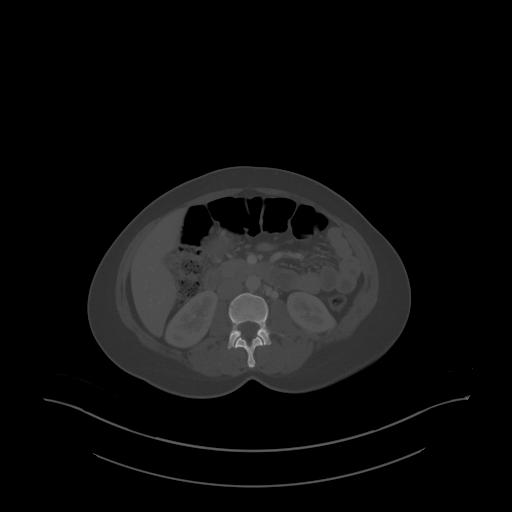
[im 62/86  soft-tissue]
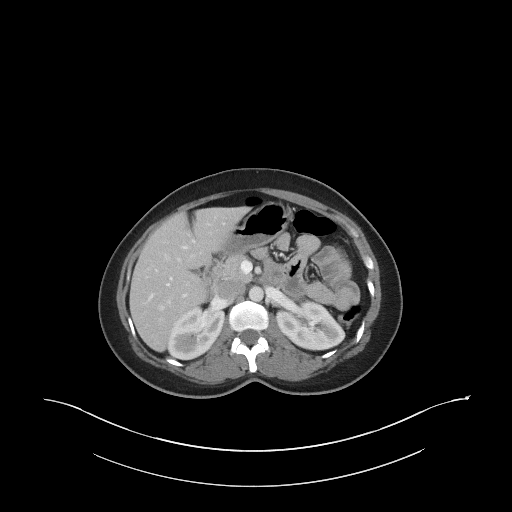
[im 69/86  soft-tissue]
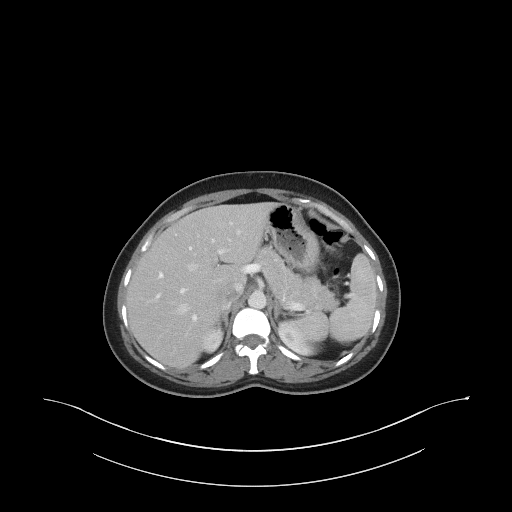
[im 75/86  soft-tissue]
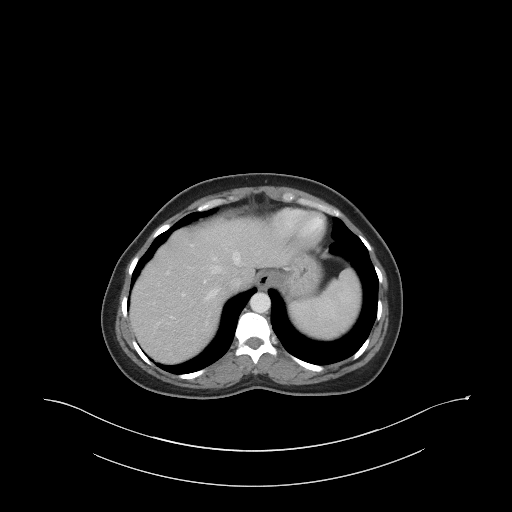
[im 82/86  soft-tissue]
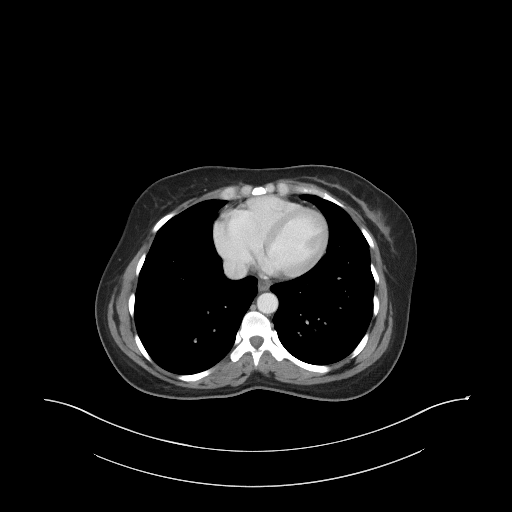

[Series 6: coronal st · coronal · 0.62mm/px · 3 of 101 slices shown]
[im 34/101  soft-tissue]
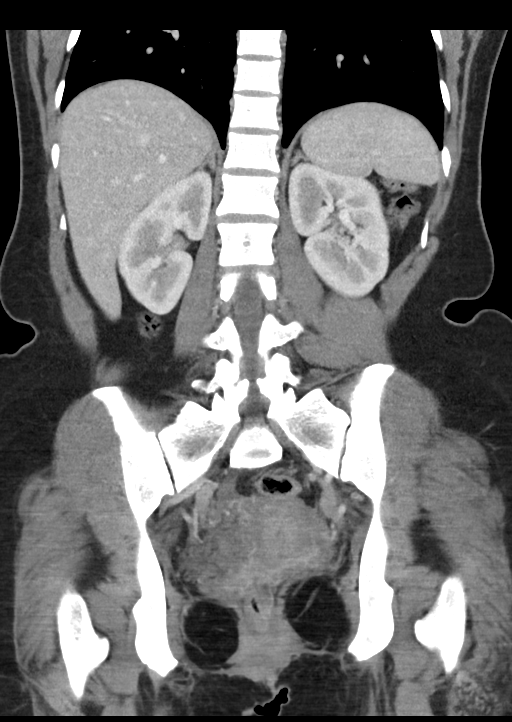
[im 45/101  soft-tissue]
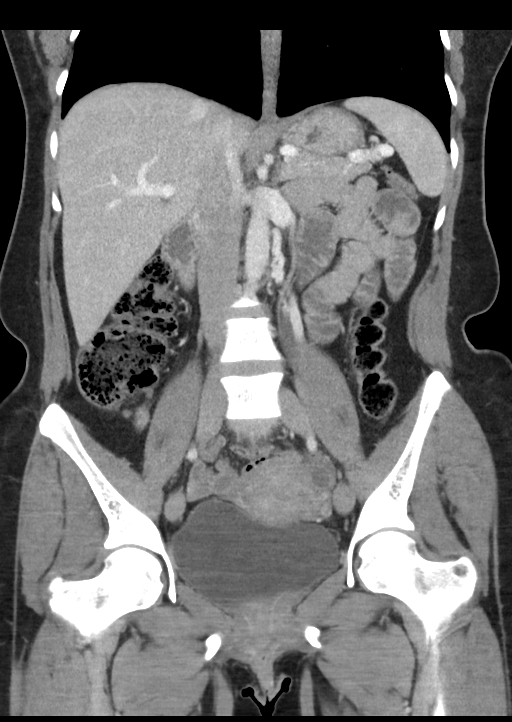
[im 56/101  soft-tissue]
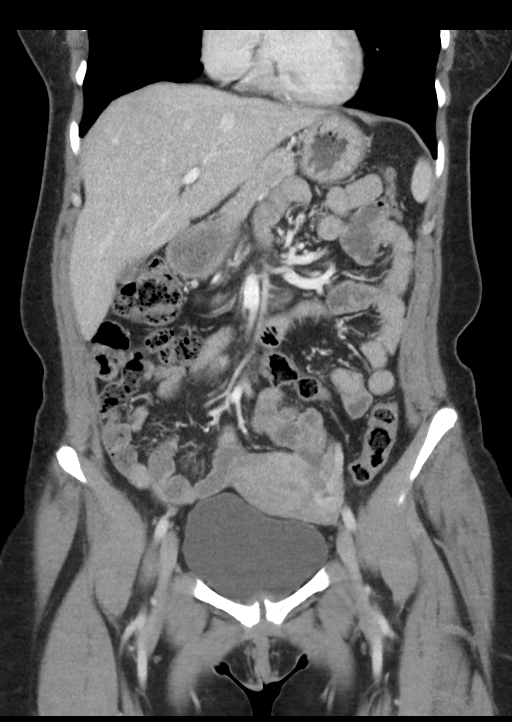

[16 of 46 positions shown; findings below may reference images not displayed]

RADIATION DOSE REDUCTION: This exam was performed according to the
departmental dose-optimization program which includes automated
exposure control, adjustment of the mA and/or kV according to
patient size and/or use of iterative reconstruction technique.

CONTRAST:  100mL OMNIPAQUE IOHEXOL 300 MG/ML  SOLN
FINDINGS: Lower chest: No acute abnormality.

Hepatobiliary: No focal liver abnormality is seen. No gallstones,
gallbladder wall thickening, or biliary dilatation.

Pancreas: Unremarkable. No pancreatic ductal dilatation or
surrounding inflammatory changes.

Spleen: Normal in size without focal abnormality.

Adrenals/Urinary Tract: Adrenal glands are unremarkable. Kidneys are
normal, without renal calculi, focal lesion, or hydronephrosis.
Bladder is unremarkable.

Stomach/Bowel: Stomach is within normal limits. Appendix appears
normal. No evidence of bowel wall thickening, distention, or
inflammatory changes.

Vascular/Lymphatic: No significant vascular findings are present. No
enlarged abdominal or pelvic lymph nodes.

Reproductive: Uterus and bilateral adnexa are unremarkable.

Other: No abdominal wall hernia or abnormality. No abdominopelvic
ascites.

Musculoskeletal: No acute or significant osseous findings.
IMPRESSION: No acute findings in the abdomen or pelvis.

## 2023-12-27 ENCOUNTER — Encounter: Payer: Self-pay | Admitting: Gastroenterology

## 2024-02-03 ENCOUNTER — Ambulatory Visit: Admitting: Gastroenterology

## 2024-03-19 ENCOUNTER — Telehealth: Payer: Self-pay | Admitting: Gastroenterology

## 2024-03-19 NOTE — Telephone Encounter (Signed)
 Good Afternoon Dr. Avram,    We received a referral from this patient stating that she needed to be seen for H- pylori. Seem that this patient was seen back in January for Abdominal pain with [facility]. Would you please advise on how to schedule this patient.    Thank you .

## 2024-03-19 NOTE — Telephone Encounter (Signed)
 Decline to accept patient with established GI provider given our existing backlog of patients

## 2024-03-21 ENCOUNTER — Ambulatory Visit: Admitting: Gastroenterology
